# Patient Record
Sex: Male | Born: 1935 | Race: White | Hispanic: No | Marital: Married | Smoking: Former smoker
Health system: Southern US, Community
[De-identification: ages and names within clinical notes are randomized; demographics above are authoritative.]

## PROBLEM LIST (undated history)

## (undated) DIAGNOSIS — I10 Essential (primary) hypertension: Secondary | ICD-10-CM

## (undated) DIAGNOSIS — C801 Malignant (primary) neoplasm, unspecified: Secondary | ICD-10-CM

## (undated) DIAGNOSIS — I4891 Unspecified atrial fibrillation: Secondary | ICD-10-CM

## (undated) DIAGNOSIS — M1990 Unspecified osteoarthritis, unspecified site: Secondary | ICD-10-CM

## (undated) DIAGNOSIS — E785 Hyperlipidemia, unspecified: Secondary | ICD-10-CM

## (undated) DIAGNOSIS — I2510 Atherosclerotic heart disease of native coronary artery without angina pectoris: Secondary | ICD-10-CM

## (undated) DIAGNOSIS — A389 Scarlet fever, uncomplicated: Secondary | ICD-10-CM

## (undated) HISTORY — PX: CORONARY ARTERY BYPASS GRAFT: SHX141

## (undated) HISTORY — PX: CARDIAC CATHETERIZATION: SHX172

## (undated) HISTORY — PX: CATARACT EXTRACTION W/ INTRAOCULAR LENS  IMPLANT, BILATERAL: SHX1307

## (undated) HISTORY — PX: PROSTATE SURGERY: SHX751

## (undated) HISTORY — PX: NASAL SINUS SURGERY: SHX719

## (undated) HISTORY — PX: JOINT REPLACEMENT: SHX530

---

## 2005-09-27 ENCOUNTER — Other Ambulatory Visit: Payer: Self-pay

## 2005-09-27 ENCOUNTER — Ambulatory Visit: Payer: Self-pay | Admitting: Urology

## 2005-10-05 ENCOUNTER — Ambulatory Visit: Payer: Self-pay | Admitting: Urology

## 2006-10-05 ENCOUNTER — Other Ambulatory Visit: Payer: Self-pay

## 2006-10-05 ENCOUNTER — Ambulatory Visit: Payer: Self-pay | Admitting: Unknown Physician Specialty

## 2007-10-26 ENCOUNTER — Other Ambulatory Visit: Payer: Self-pay

## 2007-10-26 ENCOUNTER — Inpatient Hospital Stay: Payer: Self-pay | Admitting: Internal Medicine

## 2008-07-18 ENCOUNTER — Ambulatory Visit: Payer: Self-pay | Admitting: Orthopedic Surgery

## 2009-02-24 ENCOUNTER — Ambulatory Visit: Payer: Self-pay | Admitting: Unknown Physician Specialty

## 2009-03-03 ENCOUNTER — Inpatient Hospital Stay: Payer: Self-pay | Admitting: Unknown Physician Specialty

## 2009-03-27 ENCOUNTER — Ambulatory Visit: Payer: Self-pay | Admitting: Family Medicine

## 2011-04-20 ENCOUNTER — Observation Stay: Payer: Self-pay | Admitting: Cardiology

## 2013-06-25 ENCOUNTER — Ambulatory Visit: Payer: Self-pay | Admitting: Otolaryngology

## 2013-07-02 ENCOUNTER — Ambulatory Visit: Payer: Self-pay | Admitting: Otolaryngology

## 2013-07-02 LAB — BASIC METABOLIC PANEL
Chloride: 106 mmol/L (ref 98–107)
Co2: 29 mmol/L (ref 21–32)
EGFR (Non-African Amer.): 56 — ABNORMAL LOW
Glucose: 100 mg/dL — ABNORMAL HIGH (ref 65–99)
Osmolality: 280 (ref 275–301)

## 2013-07-02 LAB — CBC WITH DIFFERENTIAL/PLATELET
Basophil #: 0 10*3/uL (ref 0.0–0.1)
Eosinophil #: 0.3 10*3/uL (ref 0.0–0.7)
Eosinophil %: 3.8 %
HGB: 13.6 g/dL (ref 13.0–18.0)
Lymphocyte #: 1.6 10*3/uL (ref 1.0–3.6)
MCH: 32.4 pg (ref 26.0–34.0)
MCHC: 34.7 g/dL (ref 32.0–36.0)
Monocyte #: 0.7 x10 3/mm (ref 0.2–1.0)
Monocyte %: 10.1 %
Neutrophil #: 4.8 10*3/uL (ref 1.4–6.5)
Neutrophil %: 64.5 %
Platelet: 190 10*3/uL (ref 150–440)
RBC: 4.21 10*6/uL — ABNORMAL LOW (ref 4.40–5.90)

## 2013-07-05 ENCOUNTER — Ambulatory Visit: Payer: Self-pay | Admitting: Otolaryngology

## 2013-07-06 LAB — PATHOLOGY REPORT

## 2013-07-26 LAB — CULTURE, FUNGUS WITHOUT SMEAR

## 2013-08-16 ENCOUNTER — Ambulatory Visit: Payer: Self-pay | Admitting: Neurological Surgery

## 2014-11-27 ENCOUNTER — Emergency Department: Payer: Self-pay | Admitting: Emergency Medicine

## 2014-11-27 LAB — CBC WITH DIFFERENTIAL/PLATELET
BASOS PCT: 0.2 %
Basophil #: 0 10*3/uL (ref 0.0–0.1)
EOS PCT: 0.4 %
Eosinophil #: 0.1 10*3/uL (ref 0.0–0.7)
HCT: 38.3 % — AB (ref 40.0–52.0)
HGB: 12.5 g/dL — ABNORMAL LOW (ref 13.0–18.0)
LYMPHS ABS: 1.4 10*3/uL (ref 1.0–3.6)
Lymphocyte %: 10.5 %
MCH: 32.2 pg (ref 26.0–34.0)
MCHC: 32.6 g/dL (ref 32.0–36.0)
MCV: 99 fL (ref 80–100)
Monocyte #: 1.5 x10 3/mm — ABNORMAL HIGH (ref 0.2–1.0)
Monocyte %: 11.5 %
NEUTROS PCT: 77.4 %
Neutrophil #: 10.3 10*3/uL — ABNORMAL HIGH (ref 1.4–6.5)
Platelet: 189 10*3/uL (ref 150–440)
RBC: 3.88 10*6/uL — ABNORMAL LOW (ref 4.40–5.90)
RDW: 12.9 % (ref 11.5–14.5)
WBC: 13.3 10*3/uL — AB (ref 3.8–10.6)

## 2014-11-27 LAB — URINALYSIS, COMPLETE
BILIRUBIN, UR: NEGATIVE
Blood: NEGATIVE
GLUCOSE, UR: NEGATIVE mg/dL (ref 0–75)
KETONE: NEGATIVE
Leukocyte Esterase: NEGATIVE
Nitrite: NEGATIVE
Ph: 5 (ref 4.5–8.0)
Protein: >=500
RBC,UR: 44 /HPF (ref 0–5)
Specific Gravity: 1.027 (ref 1.003–1.030)
WBC UR: 26 /HPF (ref 0–5)

## 2014-11-27 LAB — BASIC METABOLIC PANEL
ANION GAP: 7 (ref 7–16)
Anion Gap: 9 (ref 7–16)
BUN: 24 mg/dL — ABNORMAL HIGH (ref 7–18)
BUN: 23 mg/dL — ABNORMAL HIGH (ref 7–18)
CALCIUM: 8.6 mg/dL (ref 8.5–10.1)
CHLORIDE: 108 mmol/L — AB (ref 98–107)
CO2: 25 mmol/L (ref 21–32)
CREATININE: 1.75 mg/dL — AB (ref 0.60–1.30)
Calcium, Total: 8.5 mg/dL (ref 8.5–10.1)
Chloride: 108 mmol/L — ABNORMAL HIGH (ref 98–107)
Co2: 22 mmol/L (ref 21–32)
Creatinine: 1.74 mg/dL — ABNORMAL HIGH (ref 0.60–1.30)
EGFR (African American): 48 — ABNORMAL LOW
EGFR (Non-African Amer.): 40 — ABNORMAL LOW
GFR CALC AF AMER: 48 — AB
GFR CALC NON AF AMER: 40 — AB
GLUCOSE: 105 mg/dL — AB (ref 65–99)
Glucose: 121 mg/dL — ABNORMAL HIGH (ref 65–99)
OSMOLALITY: 283 (ref 275–301)
OSMOLALITY: 283 (ref 275–301)
POTASSIUM: 4 mmol/L (ref 3.5–5.1)
Potassium: 4.6 mmol/L (ref 3.5–5.1)
SODIUM: 139 mmol/L (ref 136–145)
Sodium: 140 mmol/L (ref 136–145)

## 2014-11-27 LAB — ETHANOL: Ethanol: <3 mg/dL

## 2014-11-27 LAB — TROPONIN I

## 2014-11-29 LAB — URINE CULTURE

## 2015-04-11 NOTE — Op Note (Signed)
PATIENT NAME:  Steve Aguirre, Steve Aguirre MR#:  [id number] DATE OF BIRTH:  03-22-1927  DATE OF PROCEDURE:  07/05/2013  PREOPERATIVE DIAGNOSES:  1.  Chronic pansinusitis (status post previous sinus surgery, 50 years ago.).  2.  Septal deviation.   POSTOPERATIVE DIAGNOSES:  1.  Chronic pansinusitis (status post previous sinus surgery, 50 years ago.).  2.  Septal deviation.   PROCEDURES: 1.  Image guided sinus surgery (Stryker navigation).  2.  Revision bilateral frontal sinusotomies with tissue removal.  3.  Revision anterior and posterior ethmoidectomies with tissue removal.  4.  Bilateral sphenoidectomies with tissue removal.  5.  Revision bilateral maxillary sinus antrostomies with tissue removal.  6.  Septoplasty.   SURGEON: Janalee Dane, MD  ANESTHESIA: General endotracheal.   DESCRIPTION OF PROCEDURE:  The patient was identified in the holding area and was brought back to the operating room in the supine position on the operating room table.  After general endotracheal anesthesia had been induced the patient was turned 90 degrees counter clockwise from anesthesia.  The nose was anesthetized with infraorbital nerve blocks and septal injection with 0.5% Lidocaine and 0.25% Bupivacaine mixed with 1:150,000 with Epinephrine and phenylephrine Lidocaine soaked pledgets, two on each side were placed and the face was prepped and draped in the usual fashion.  The pledgets were removed.  A 15 blade was used to make a left-sided hemitransfixion incision and septal mucoperichondrial mucoperiosteal leaflets elevated.  There was a large inferior spur that was resected with Jansen-Middleton forceps.  The remaining septum was deviated back and forth in an accordion like fashion.  The bony cartilaginous junction was then divided and a moderate amount of vomer and perpendicular plate was taken down with Jansen-Middleton forceps, releasing the tension on the remaining septum.  The septum then swung back into the  midline.  The septal leaflets were closed with quilting 4-0 chromic suture.  The left sided hemitransfixion incision was closed with 4-0 plain gut.  Attention was directed to the turbinates which had been previously injected on the left.  The head of the inferior turbinate on the left was incised with a 15 blade and the medial mucoperiosteum was elevated using a Psychologist, educational.  Once this had been elevated Knight scissors were used to resect the conchal bone and lateral mucoperiosteum.   The image-guided sinus surgery system mask was attached and registration was carried out in the standard fashion using the appropriate fiduciary points. Calibration of the system was confirmed and extensive review preoperatively and intraoperatively was carried out.   Attention was directed to the sinus surgery portion of the surgery. The left side was addressed first. The middle turbinate was gently medialized. The uncinate process was then identified and completely resected revealing the natural maxillary sinus ostium. The natural maxillary sinus ostium was progressively enlarged by removing tissue to create a large maxillary antrostomy, removing the diseased tissue. A culture swab was then used to culture the purulence in the maxillary antrum.  The maxillary antrum was then irrigated copiously with saline. Attention was directed to the ethmoid sinuses where bone and mucosal tissue from the ethmoid sinus was taken down from anterior to posterior preserving the skull base and lamina papyracea, removing diseased tissue. After the ethmoid sinuses had been completely cleaned out of polypoid chronically inflamed mucosa preserving the mucosa on the medial side of the middle turbinate along the skull base and along the lamina papyracea, attention was directed to the sphenoid recess. The sphenoid sinus ostium was identified and  progressively enlarged with Kerrison Rongeur, staying inferior and medial in orientation, to create a large  sphenoid sinus antrostomy, removing diseased tissue. The 30 degree scope was then used to explore the frontal recess. The frontal recess was progressively enlarged meticulously, removing diseased tissue, and the frontal sinus was then copiously irrigated with saline as was the sphenoid sinus. Attention was then directed to the right side where an identical series of procedures was accomplished. Upon completion of the sinus surgery, the Surgiflo was placed. A total of one unit of Surgiflo was used. Estimated blood loss was less than 10 milliliters.  Temporary Telfa pledgets were placed and tied over the columella to prevent dislodging. The patient was allowed to emerge from anesthesia, extubated in the operating room and taken to the recovery room in stable condition.  There were no complications.    FINDINGS: There was diffuse polypoid disease involving the all of the paranasal sinuses. There was profuse pus in the left maxillary and right ethmoid sinuses. The left maxillary sinus pus was cultured with a culture swab.    ____________________________ J. Nadeen Landau, MD jmc:cc D: 07/05/2013 20:28:10 ET T: 07/05/2013 22:18:04 ET JOB#: [id number]  cc: Janalee Dane, MD, <Dictator> Nicholos Johns MD ELECTRONICALLY SIGNED 08/01/2013 51:76

## 2016-01-02 ENCOUNTER — Other Ambulatory Visit
Admission: RE | Admit: 2016-01-02 | Discharge: 2016-01-02 | Disposition: A | Discharge status: Home / Self Care | Payer: Medicare Other | Source: Ambulatory Visit | Attending: Cardiology | Admitting: Cardiology

## 2016-01-02 DIAGNOSIS — R0789 Other chest pain: Secondary | ICD-10-CM | POA: Diagnosis present

## 2016-01-02 LAB — TROPONIN I: TROPONIN I: 0.03 ng/mL (ref <0.031)

## 2016-01-21 ENCOUNTER — Encounter
Admission: RE | Admit: 2016-01-21 | Discharge: 2016-01-21 | Disposition: A | Discharge status: Home / Self Care | Payer: Medicare Other | Source: Ambulatory Visit | Attending: Cardiology | Admitting: Cardiology

## 2016-01-21 DIAGNOSIS — I2510 Atherosclerotic heart disease of native coronary artery without angina pectoris: Secondary | ICD-10-CM | POA: Diagnosis not present

## 2016-01-21 DIAGNOSIS — R001 Bradycardia, unspecified: Secondary | ICD-10-CM | POA: Insufficient documentation

## 2016-01-21 DIAGNOSIS — Z01812 Encounter for preprocedural laboratory examination: Secondary | ICD-10-CM | POA: Insufficient documentation

## 2016-01-21 DIAGNOSIS — Z01818 Encounter for other preprocedural examination: Secondary | ICD-10-CM

## 2016-01-21 DIAGNOSIS — I4891 Unspecified atrial fibrillation: Secondary | ICD-10-CM | POA: Diagnosis not present

## 2016-01-21 DIAGNOSIS — E785 Hyperlipidemia, unspecified: Secondary | ICD-10-CM | POA: Diagnosis not present

## 2016-01-21 HISTORY — DX: Atherosclerotic heart disease of native coronary artery without angina pectoris: I25.10

## 2016-01-21 HISTORY — DX: Unspecified osteoarthritis, unspecified site: M19.90

## 2016-01-21 HISTORY — DX: Malignant (primary) neoplasm, unspecified: C80.1

## 2016-01-21 HISTORY — DX: Unspecified atrial fibrillation: I48.91

## 2016-01-21 HISTORY — DX: Scarlet fever, uncomplicated: A38.9

## 2016-01-21 HISTORY — DX: Essential (primary) hypertension: I10

## 2016-01-21 HISTORY — DX: Hyperlipidemia, unspecified: E78.5

## 2016-01-21 LAB — DIFFERENTIAL
Basophils Absolute: 0 10*3/uL (ref 0–0.1)
Basophils Relative: 0 %
Eosinophils Absolute: 0.1 10*3/uL (ref 0–0.7)
Eosinophils Relative: 2 %
LYMPHS PCT: 14 %
Lymphs Abs: 1.4 10*3/uL (ref 1.0–3.6)
MONO ABS: 0.7 10*3/uL (ref 0.2–1.0)
Monocytes Relative: 8 %
NEUTROS ABS: 7.7 10*3/uL — AB (ref 1.4–6.5)
NEUTROS PCT: 76 %

## 2016-01-21 LAB — APTT: APTT: 52 s — AB (ref 24–36)

## 2016-01-21 LAB — BASIC METABOLIC PANEL
Anion gap: 7 (ref 5–15)
BUN: 23 mg/dL — AB (ref 6–20)
CO2: 28 mmol/L (ref 22–32)
CREATININE: 1.19 mg/dL (ref 0.61–1.24)
Calcium: 9.4 mg/dL (ref 8.9–10.3)
Chloride: 104 mmol/L (ref 101–111)
GFR calc Af Amer: >60 mL/min (ref >60)
GFR calc non Af Amer: 53 mL/min — ABNORMAL LOW (ref >60)
Glucose, Bld: 113 mg/dL — ABNORMAL HIGH (ref 65–99)
POTASSIUM: 4.5 mmol/L (ref 3.5–5.1)
SODIUM: 139 mmol/L (ref 135–145)

## 2016-01-21 LAB — CBC
HCT: 38.3 % — ABNORMAL LOW (ref 40.0–52.0)
Hemoglobin: 12.9 g/dL — ABNORMAL LOW (ref 13.0–18.0)
MCH: 31.2 pg (ref 26.0–34.0)
MCHC: 33.6 g/dL (ref 32.0–36.0)
MCV: 92.9 fL (ref 80.0–100.0)
PLATELETS: 269 10*3/uL (ref 150–440)
RBC: 4.13 MIL/uL — AB (ref 4.40–5.90)
RDW: 13.4 % (ref 11.5–14.5)
WBC: 10 10*3/uL (ref 3.8–10.6)

## 2016-01-21 LAB — PROTIME-INR
INR: 1.11
PROTHROMBIN TIME: 14.5 s (ref 11.4–15.0)

## 2016-01-21 LAB — SURGICAL PCR SCREEN
MRSA, PCR: NEGATIVE
STAPHYLOCOCCUS AUREUS: NEGATIVE

## 2016-01-21 NOTE — Patient Instructions (Signed)
  Your procedure is scheduled on: 01/26/16 Mon Report to Day Surgery 2nd floor medical mall To find out your arrival time please call [phone] between 1PM - 3PM on 01/23/16.  Remember: Instructions that are not followed completely may result in serious medical risk, up to and including death, or upon the discretion of your surgeon and anesthesiologist your surgery may need to be rescheduled.    _x___ 1. Do not eat food or drink liquids after midnight. No gum chewing or hard candies.     ____ 2. No Alcohol for 24 hours before or after surgery.   ____ 3. Bring all medications with you on the day of surgery if instructed.    __x__ 4. Notify your doctor if there is any change in your medical condition     (cold, fever, infections).     Do not wear jewelry, make-up, hairpins, clips or nail polish.  Do not wear lotions, powders, or perfumes. You may wear deodorant.  Do not shave 48 hours prior to surgery. Men may shave face and neck.  Do not bring valuables to the hospital.    [facility] is not responsible for any belongings or valuables.               Contacts, dentures or bridgework may not be worn into surgery.  Leave your suitcase in the car. After surgery it may be brought to your room.  For patients admitted to the hospital, discharge time is determined by your                treatment team.   Patients discharged the day of surgery will not be allowed to drive home.   Please read over the following fact sheets that you were given:   MRSA Information   _x___ Take these medicines the morning of surgery with A SIP OF WATER:    1. famotidine (PEPCID) 20 MG tablet  2. ramipril (ALTACE) 10 MG capsule  3.   4.  5.  6.  ____ Fleet Enema (as directed)   _x___ Use CHG Soap as directed  ____ Use inhalers on the day of surgery  ____ Stop metformin 2 days prior to surgery    ____ Take 1/2 of usual insulin dose the night before surgery and none on the morning of surgery.   _x___  Stop Coumadin/Plavix/aspirin on Stop Xarelto 3 days before surgery per Dr Ubaldo Glassing, ask Dr Ubaldo Glassing about stopping Aspirin  ____ Stop Anti-inflammatories on    ____ Stop supplements until after surgery.    ____ Bring C-Pap to the hospital.

## 2016-01-26 ENCOUNTER — Ambulatory Visit: Payer: Medicare Other | Admitting: Anesthesiology

## 2016-01-26 ENCOUNTER — Ambulatory Visit
Admission: RE | Admit: 2016-01-26 | Discharge: 2016-01-27 | Disposition: A | Discharge status: Home / Self Care | Payer: Medicare Other | Source: Ambulatory Visit | Attending: Cardiology | Admitting: Cardiology

## 2016-01-26 ENCOUNTER — Encounter: Payer: Self-pay | Admitting: *Deleted

## 2016-01-26 ENCOUNTER — Encounter
Admission: RE | Disposition: A | Discharge status: Home / Self Care | Payer: Self-pay | Source: Ambulatory Visit | Attending: Cardiology

## 2016-01-26 ENCOUNTER — Observation Stay: Payer: Medicare Other

## 2016-01-26 ENCOUNTER — Ambulatory Visit: Payer: Medicare Other

## 2016-01-26 DIAGNOSIS — R109 Unspecified abdominal pain: Secondary | ICD-10-CM | POA: Insufficient documentation

## 2016-01-26 DIAGNOSIS — Z7982 Long term (current) use of aspirin: Secondary | ICD-10-CM | POA: Diagnosis not present

## 2016-01-26 DIAGNOSIS — I517 Cardiomegaly: Secondary | ICD-10-CM | POA: Insufficient documentation

## 2016-01-26 DIAGNOSIS — I482 Chronic atrial fibrillation: Secondary | ICD-10-CM | POA: Diagnosis not present

## 2016-01-26 DIAGNOSIS — Z96652 Presence of left artificial knee joint: Secondary | ICD-10-CM | POA: Insufficient documentation

## 2016-01-26 DIAGNOSIS — I25810 Atherosclerosis of coronary artery bypass graft(s) without angina pectoris: Secondary | ICD-10-CM | POA: Diagnosis not present

## 2016-01-26 DIAGNOSIS — Z7901 Long term (current) use of anticoagulants: Secondary | ICD-10-CM | POA: Diagnosis not present

## 2016-01-26 DIAGNOSIS — I10 Essential (primary) hypertension: Secondary | ICD-10-CM | POA: Diagnosis not present

## 2016-01-26 DIAGNOSIS — Z8546 Personal history of malignant neoplasm of prostate: Secondary | ICD-10-CM | POA: Diagnosis not present

## 2016-01-26 DIAGNOSIS — E782 Mixed hyperlipidemia: Secondary | ICD-10-CM | POA: Diagnosis not present

## 2016-01-26 DIAGNOSIS — Z955 Presence of coronary angioplasty implant and graft: Secondary | ICD-10-CM | POA: Insufficient documentation

## 2016-01-26 DIAGNOSIS — R0602 Shortness of breath: Secondary | ICD-10-CM | POA: Insufficient documentation

## 2016-01-26 DIAGNOSIS — Z951 Presence of aortocoronary bypass graft: Secondary | ICD-10-CM | POA: Insufficient documentation

## 2016-01-26 DIAGNOSIS — Z8249 Family history of ischemic heart disease and other diseases of the circulatory system: Secondary | ICD-10-CM | POA: Insufficient documentation

## 2016-01-26 DIAGNOSIS — Z79899 Other long term (current) drug therapy: Secondary | ICD-10-CM | POA: Diagnosis not present

## 2016-01-26 DIAGNOSIS — M1990 Unspecified osteoarthritis, unspecified site: Secondary | ICD-10-CM | POA: Insufficient documentation

## 2016-01-26 DIAGNOSIS — Z87898 Personal history of other specified conditions: Secondary | ICD-10-CM | POA: Insufficient documentation

## 2016-01-26 DIAGNOSIS — Z833 Family history of diabetes mellitus: Secondary | ICD-10-CM | POA: Insufficient documentation

## 2016-01-26 DIAGNOSIS — R079 Chest pain, unspecified: Secondary | ICD-10-CM

## 2016-01-26 DIAGNOSIS — Z807 Family history of other malignant neoplasms of lymphoid, hematopoietic and related tissues: Secondary | ICD-10-CM | POA: Diagnosis not present

## 2016-01-26 DIAGNOSIS — Z950 Presence of cardiac pacemaker: Secondary | ICD-10-CM

## 2016-01-26 DIAGNOSIS — Z9849 Cataract extraction status, unspecified eye: Secondary | ICD-10-CM | POA: Insufficient documentation

## 2016-01-26 DIAGNOSIS — R001 Bradycardia, unspecified: Secondary | ICD-10-CM | POA: Insufficient documentation

## 2016-01-26 DIAGNOSIS — R531 Weakness: Secondary | ICD-10-CM | POA: Insufficient documentation

## 2016-01-26 DIAGNOSIS — I495 Sick sinus syndrome: Secondary | ICD-10-CM | POA: Diagnosis present

## 2016-01-26 DIAGNOSIS — Z87891 Personal history of nicotine dependence: Secondary | ICD-10-CM | POA: Diagnosis not present

## 2016-01-26 DIAGNOSIS — R5383 Other fatigue: Secondary | ICD-10-CM | POA: Insufficient documentation

## 2016-01-26 HISTORY — PX: PACEMAKER INSERTION: SHX728

## 2016-01-26 SURGERY — INSERTION, CARDIAC PACEMAKER
Anesthesia: Monitor Anesthesia Care

## 2016-01-26 MED ORDER — ACETAMINOPHEN 325 MG PO TABS: 325.0000 mg | ORAL_TABLET | ORAL | Status: DC | PRN

## 2016-01-26 MED ORDER — SIMVASTATIN 40 MG PO TABS
40.0000 mg | ORAL_TABLET | Freq: Every day | ORAL | Status: DC
  Administered 2016-01-26: 40 mg via ORAL
  Filled 2016-01-26 (×2): qty 1

## 2016-01-26 MED ORDER — FENTANYL CITRATE (PF) 100 MCG/2ML IJ SOLN: 25.0000 ug | INTRAMUSCULAR | Status: DC | PRN

## 2016-01-26 MED ORDER — SODIUM CHLORIDE 0.9 % IJ SOLN
INTRAMUSCULAR | Status: DC | PRN
  Administered 2016-01-26: 15:00:00
  Administered 2016-01-26: 6 mL via INTRAVENOUS

## 2016-01-26 MED ORDER — LORAZEPAM 2 MG/ML IJ SOLN
1.0000 mg | Freq: Four times a day (QID) | INTRAMUSCULAR | Status: DC | PRN
  Administered 2016-01-27: 1 mg via INTRAVENOUS
  Filled 2016-01-26 (×2): qty 1

## 2016-01-26 MED ORDER — CEFAZOLIN SODIUM 1-5 GM-% IV SOLN
1.0000 g | Freq: Four times a day (QID) | INTRAVENOUS | Status: AC
  Administered 2016-01-26 – 2016-01-27 (×3): 1 g via INTRAVENOUS
  Filled 2016-01-26 (×4): qty 50

## 2016-01-26 MED ORDER — SODIUM CHLORIDE FLUSH 0.9 % IV SOLN
INTRAVENOUS | Status: AC
  Filled 2016-01-26: qty 20

## 2016-01-26 MED ORDER — SODIUM CHLORIDE 0.9 % IR SOLN
Freq: Once | Status: DC
  Filled 2016-01-26 (×2): qty 2

## 2016-01-26 MED ORDER — CEFAZOLIN SODIUM 1-5 GM-% IV SOLN
INTRAVENOUS | Status: AC
  Administered 2016-01-26: 11:00:00
  Filled 2016-01-26: qty 50

## 2016-01-26 MED ORDER — ONDANSETRON HCL 4 MG/2ML IJ SOLN: 4.0000 mg | Freq: Once | INTRAMUSCULAR | Status: DC | PRN

## 2016-01-26 MED ORDER — RAMIPRIL 2.5 MG PO CAPS
10.0000 mg | ORAL_CAPSULE | Freq: Every day | ORAL | Status: DC
  Administered 2016-01-27: 10 mg via ORAL
  Filled 2016-01-26: qty 4
  Filled 2016-01-26: qty 1

## 2016-01-26 MED ORDER — LACTATED RINGERS IV SOLN
INTRAVENOUS | Status: DC
  Administered 2016-01-26: 13:00:00 via INTRAVENOUS

## 2016-01-26 MED ORDER — MIDAZOLAM HCL 2 MG/2ML IJ SOLN
INTRAMUSCULAR | Status: DC | PRN
  Administered 2016-01-26: .5 mg via INTRAVENOUS

## 2016-01-26 MED ORDER — GENTAMICIN SULFATE 40 MG/ML IJ SOLN
INTRAMUSCULAR | Status: AC
  Filled 2016-01-26: qty 2

## 2016-01-26 MED ORDER — LIDOCAINE 1 % OPTIME INJ - NO CHARGE
INTRAMUSCULAR | Status: DC | PRN
  Administered 2016-01-26: 26 mL

## 2016-01-26 MED ORDER — FENTANYL CITRATE (PF) 100 MCG/2ML IJ SOLN
INTRAMUSCULAR | Status: DC | PRN
  Administered 2016-01-26 (×2): 25 ug via INTRAVENOUS

## 2016-01-26 MED ORDER — CEFAZOLIN SODIUM 1-5 GM-% IV SOLN
1.0000 g | Freq: Once | INTRAVENOUS | Status: AC
  Administered 2016-01-26: 1 g via INTRAVENOUS

## 2016-01-26 MED ORDER — ONDANSETRON HCL 4 MG/2ML IJ SOLN: 4.0000 mg | Freq: Four times a day (QID) | INTRAMUSCULAR | Status: DC | PRN

## 2016-01-26 MED ORDER — LIDOCAINE HCL (CARDIAC) 20 MG/ML IV SOLN
INTRAVENOUS | Status: DC | PRN
  Administered 2016-01-26: 40 mg via INTRAVENOUS

## 2016-01-26 MED ORDER — PROPOFOL 10 MG/ML IV BOLUS
INTRAVENOUS | Status: DC | PRN
  Administered 2016-01-26: 20 mg via INTRAVENOUS
  Administered 2016-01-26: 10 mg via INTRAVENOUS
  Administered 2016-01-26: 20 mg via INTRAVENOUS
  Administered 2016-01-26: 10 mg via INTRAVENOUS
  Administered 2016-01-26 (×2): 20 mg via INTRAVENOUS

## 2016-01-26 MED ORDER — PROPOFOL 500 MG/50ML IV EMUL
INTRAVENOUS | Status: DC | PRN
  Administered 2016-01-26: 50 ug/kg/min via INTRAVENOUS

## 2016-01-26 MED ORDER — SODIUM CHLORIDE 0.9 % IJ SOLN
INTRAMUSCULAR | Status: AC
  Filled 2016-01-26: qty 50

## 2016-01-26 MED ORDER — SODIUM CHLORIDE 0.9 % IR SOLN
Status: DC | PRN
  Administered 2016-01-26: 50 mL

## 2016-01-26 SURGICAL SUPPLY — 35 items
BAG DECANTER FOR FLEXI CONT (MISCELLANEOUS) ×3 IMPLANT
BRUSH SCRUB 4% CHG (MISCELLANEOUS) ×3 IMPLANT
CABLE SURG 12 DISP A/V CHANNEL (MISCELLANEOUS) ×3 IMPLANT
CANISTER SUCT 1200ML W/VALVE (MISCELLANEOUS) ×3 IMPLANT
CHLORAPREP W/TINT 26ML (MISCELLANEOUS) ×3 IMPLANT
COVER LIGHT HANDLE STERIS (MISCELLANEOUS) ×6 IMPLANT
COVER MAYO STAND STRL (DRAPES) ×3 IMPLANT
DRAPE C-ARM XRAY 36X54 (DRAPES) ×3 IMPLANT
DRESSING TELFA 4X3 1S ST N-ADH (GAUZE/BANDAGES/DRESSINGS) ×3 IMPLANT
DRSG TEGADERM 4X4.75 (GAUZE/BANDAGES/DRESSINGS) ×3 IMPLANT
ELECT REM PT RETURN 9FT ADLT (ELECTROSURGICAL) ×3
ELECTRODE REM PT RTRN 9FT ADLT (ELECTROSURGICAL) ×1 IMPLANT
GLIDEWIRE STIFF .35X180X3 HYDR (WIRE) ×3 IMPLANT
GLOVE BIO SURGEON STRL SZ7.5 (GLOVE) ×3 IMPLANT
GLOVE BIO SURGEON STRL SZ8 (GLOVE) ×3 IMPLANT
GOWN STRL REUS W/ TWL LRG LVL3 (GOWN DISPOSABLE) ×1 IMPLANT
GOWN STRL REUS W/ TWL XL LVL3 (GOWN DISPOSABLE) ×1 IMPLANT
GOWN STRL REUS W/TWL LRG LVL3 (GOWN DISPOSABLE) ×2
GOWN STRL REUS W/TWL XL LVL3 (GOWN DISPOSABLE) ×2
IMMOBILIZER SHDR MD LX WHT (SOFTGOODS) IMPLANT
IMMOBILIZER SHDR XL LX WHT (SOFTGOODS) IMPLANT
INTRO PACEMKR SHEATH II 7FR (MISCELLANEOUS) ×3
INTRODUCER PACEMKR SHTH II 7FR (MISCELLANEOUS) ×1 IMPLANT
IV NS 500ML (IV SOLUTION) ×2
IV NS 500ML BAXH (IV SOLUTION) ×1 IMPLANT
KIT RM TURNOVER STRD PROC AR (KITS) ×3 IMPLANT
LABEL OR SOLS (LABEL) ×3 IMPLANT
LEAD CAPSURE NOVUS 5092-58CM (Lead) ×3 IMPLANT
MARKER SKIN DUAL TIP RULER LAB (MISCELLANEOUS) ×3 IMPLANT
PACEMAKER ADAPTA SR ADSR01 (Pacemaker) ×1 IMPLANT
PACK PACE INSERTION (MISCELLANEOUS) ×3 IMPLANT
PAD STATPAD (MISCELLANEOUS) ×3 IMPLANT
PAD TELFA 2X3 NADH STRL (GAUZE/BANDAGES/DRESSINGS) ×3 IMPLANT
PPM ADAPTA SR ADSR01 (Pacemaker) ×3 IMPLANT
SUT SILK 0 SH 30 (SUTURE) ×9 IMPLANT

## 2016-01-26 NOTE — H&P (Signed)
Printout Information Document Contents Office Visit Document Received Date 01/21/2016 Document Source Organization [facility] Patient Demographics  Reason for Visit  Encounter Details  Last Filed Vital Signs  Progress Notes  Plan of Treatment  Visit Diagnoses  Document Information Encounter Summary - Dair Necessary [id number] y.o. Steve Aguirre of Feb. 01, 2017 Patient Demographics Patient Address Communication Language Race / Ethnicity  [address] [place], [place] [id number]  [phone] [facility]) [phone] (Mobile)  English (Preferred) White / Not Hispanic or Latino  Reason for Visit Reason Comments  Fatigue i run out of gas pretty fast  Shortness of Breath when I get tired I get short of breath  Encounter Details Date Type Department Care Team Description  01/19/2016 Office Visit [facility]  [place], [place] [address]  [phone]  [name], [place] [address]  [place], [place] [address]  [phone]  [phone] (Fax)  Bradycardia (Primary Dx);Chronic atrial fibrillation ([place]);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;Essential hypertension;Hyperlipidemia, unspecified hyperlipidemia type  Last Filed Vital Signs - in this encounter Vital Sign Reading Time Taken  Blood Pressure 122/68 01/19/2016 2:47 PM EST  Pulse 54 01/19/2016 2:47 PM EST  Temperature - -  Respiratory Rate 12 01/19/2016 2:47 PM EST  Oxygen Saturation - -  Inhaled Oxygen Concentration - -  Weight 69.9 kg (154 lb 3.2 oz) 01/19/2016 2:47 PM EST  Height 165.1 cm (5\' 5" ) 01/19/2016 2:47 PM EST  Body Mass Index 25.66 01/19/2016 2:47 PM EST  Progress Notes - in this encounter [name], MD - 01/19/2016 2:30 PM EST Formatting of this note may be different from the original.   Chief Complaint: Chief Complaint  Patient presents with  . Fatigue  i run out of gas pretty fast  .  Shortness of Breath  when I get tired I get short of breath  Date of Service: 01/19/2016 Date of Birth: October 26, 1927 PCP: Macie Burows, MD  History of Present Illness: Steve Aguirre is a 82 y.o.male patient who has a history of atrial fibrillation, hypertension and hyperlipidemia. Is post coronary artery bypass grafting in 1999. He was recently seen with abdominal pain was noted to be significantly bradycardic. He was taken off of sotalol and a Holter monitor was placed showing up to 7 second pauses with atrial fibrillation with slow ventricular response. He has had no syncope but has fatigue weakness and is not improved since being taken off of sotalol. His average heart rate on his Holter monitor was 45 bpm. Risk and benefits of permanent pacemaker were discussed with the patient. Patient should not drive at least until pacemaker is placed.   Past Medical History Past Medical History  Diagnosis Date  . Arthritis  . Atrial fibrillation, unspecified ([place])  . Cataract cortical, senile  . Coronary artery disease  . History of chicken pox  . History of prostate cancer  . History of scarlet fever  . Hyperlipidemia  . Systemic primary arterial hypertension   Past Surgical History He has a past surgical history that includes Stent Placement Intracranial Percutaneous; Replacement total knee (Left, 03/03/09); Cataract extraction (11/1999); Coronary artery bypass graft (10/1998); ostatectomy Perineal (2006); and Functional endoscopic sinus surgery (08/2014).   Medications and Allergies  Current Medications  Current Outpatient Prescriptions  Medication Sig Dispense Refill  . aspirin 325 MG tablet Take 325 mg by mouth. Reported on 01/02/2016  . famotidine (PEPCID) 20 MG tablet Take 20 mg  by mouth once as needed.  . ramipril (ALTACE) 10 MG capsule TAKE 1 CAPSULE BY MOUTH ONCE A DAY 30 capsule 4  . rivaroxaban (XARELTO) 20 mg tablet Take 1 tablet (20 mg total) by mouth once daily. 30 tablet 11    . simvastatin (ZOCOR) 40 MG tablet TAKE 1 TABLET BY MOUTH AT BEDTIME 30 tablet 4   No current facility-administered medications for this visit.   Allergies: Review of patient's allergies indicates no known allergies.  Social and Family History  Social History reports that he has quit smoking. His smoking use included Cigarettes. He does not have any smokeless tobacco history on file. He reports that he does not drink alcohol.  Family History Family History  Problem Relation Age of Onset  . Heart failure Mother  . Lymphoma Father  . Diabetes type II Sister  . Heart disease Brother  s/p CABG   Review of Systems  Review of Systems  Constitutional: Negative for chills, diaphoresis, fever, malaise/fatigue and weight loss.  HENT: Negative for congestion, ear discharge, hearing loss and tinnitus.  Eyes: Negative for blurred vision.  Respiratory: Negative for hemoptysis and wheezing.  Cardiovascular: Positive for chest pain. Negative for palpitations, orthopnea, claudication, leg swelling and PND.  Gastrointestinal: Positive for abdominal pain. Negative for blood in stool, constipation, diarrhea, heartburn, melena, nausea and vomiting.  Genitourinary: Negative for dysuria, frequency, hematuria and urgency.  Musculoskeletal: Negative for back pain, falls, joint pain and myalgias.  Skin: Negative for itching and rash.  Neurological: Negative for dizziness, tingling, focal weakness, loss of consciousness, weakness and headaches.  Endo/Heme/Allergies: Negative for polydipsia. Does not bruise/bleed easily.  Psychiatric/Behavioral: Negative for depression, memory loss and substance abuse. The patient is not nervous/anxious.    Physical Examination   Vitals: Visit Vitals  . BP 122/68 (BP Location: Left upper arm, Patient Position: Sitting, BP Cuff Size: Small Adult)  . Pulse 54  . Resp 12  . Ht 165.1 cm (5\' 5" )  . Wt 69.9 kg (154 lb 3.2 oz)  . BMI 25.66 kg/m2   Ht:165.1 cm (5\' 5" )  Wt:69.9 kg (154 lb 3.2 oz) [id number] surface area is 1.79 meters squared. Body mass index is 25.66 kg/(m^2).  Wt Readings from Last 3 Encounters:  01/19/16 69.9 kg (154 lb 3.2 oz)  01/05/16 68 kg (150 lb)  01/02/16 70.4 kg (155 lb 3.2 oz)   BP Readings from Last 3 Encounters:  01/19/16 122/68  01/05/16 120/84  01/02/16 142/60   General: Caucasian male with atrial fibrillation complaining of cough and shortness of breath LUNGS Breath Sounds: Normal Percussion: Normal  CARDIOVASCULAR JVP CV wave: no HJR: no Elevation at 90 degrees: None Carotid Pulse: normal pulsation bilaterally Bruit: None Apex: apical impulse normal  Auscultation Rhythm: atrial fibrillation S1: normal S2: normal Clicks: no Rub: no Murmurs: no murmurs  Gallop: None ABDOMEN Liver enlargement: no Pulsatile aorta: no Ascites: no Bruits: no  EXTREMITIES Clubbing: no Edema: trace to 1+ bilateral pedal edema Pulses: peripheral pulses symmetrical Femoral Bruits: no Amputation: no SKIN Rash: no Cyanosis: no Embolic phemonenon: no Bruising: no NEURO Alert and Oriented to person, place and time: yes Non focal: yes LABS Last 3 CBC results: Lab Results  Component Value Date  WBC 10.4 (H) 01/02/2016  WBC 7.5 09/04/2015  WBC 8.4 12/23/2014   Lab Results  Component Value Date  HGB 12.5 (L) 01/02/2016  HGB 14.5 09/04/2015  HGB 13.4 (L) 12/23/2014   Lab Results  Component Value Date  HCT 37.8 (L) 01/02/2016  HCT 42.3 09/04/2015  HCT 39.4 (L) 12/23/2014   Lab Results  Component Value Date  PLT 223 01/02/2016  PLT 188 09/04/2015  PLT 186 12/23/2014   Lab Results  Component Value Date  CREATININE 1.2 01/02/2016  BUN 21 01/02/2016  NA 140 01/02/2016  K 4.4 01/02/2016  CL 105 01/02/2016  CO2 29.6 01/02/2016   Lab Results  Component Value Date  HDL 40.5 09/04/2015  HDL 32.0 12/23/2014   Lab Results  Component Value Date  LDLCALC 85 09/04/2015  LDLCALC 88 12/23/2014   Lab  Results  Component Value Date  TRIG 111 09/04/2015  TRIG 113 12/23/2014   Lab Results  Component Value Date  ALT 10 01/02/2016  AST 17 01/02/2016  ALKPHOS 51 01/02/2016   Assessment and Plan   82 y.o. male with  ICD-10-CM ICD-9-CM  1. Atrial fibrillation, unspecified-currently atrial fibrillation with slow ventricular response. Will continue Xarelto until right before the pacemaker. This was discontinued 2 days prior to the pacemaker. Would remain off of sotalol for now. Consideration of resuming this after pacemaker will be raised. I48.91 427.31  2. 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 no evidence of ischemia clinically, by electrocardiogram or by cardiac marker. I25.810 414.02  3. Essential hypertension-blood pressure is controlled with ramipril and dash diet. Pressure is well-controlled current I10 401.9  4. Mixed hyperlipidemia-currently stable with simvastatin at 40 mg daily which we will continue for now E78.2 272.2    Return in about 4 weeks (around 02/16/2016).  These notes generated with voice recognition software. I apologize for typographical errors.  [name], MD    Plan of Treatment - as of this encounter Upcoming Encounters Upcoming Encounters  Date Type Specialty Care Team Description  02/05/2016 Appointment Cardiology Fath, Aloha Gell, MD  [address] [place]  [place], [place] [address]  [phone]  [phone] (Fax)    03/09/2016 Appointment Internal Medicine Lovie Macadamia, MD  [address] 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 25, 2015 - Present) [phone] (Work) [phone] (Fax)  [address] [facility] [place], [place] [address] Document Coverage Dates Jan. 30, 2017 - Jan. 30, 2017 [place] [phone] (Work) [place], [place] [address] Encounter Providers [name] (Attending) [phone] (Work) [phone] (Fax)  [place], [place] [address] Encounter Date Jan. 30, 2017 - Jan. 30, 2017

## 2016-01-26 NOTE — Anesthesia Postprocedure Evaluation (Signed)
Anesthesia Post Note  Patient: Steve Aguirre  Procedure(s) Performed: Procedure(s) (LRB): INSERTION PACEMAKER (N/A)  Patient location during evaluation: PACU Anesthesia Type: General Level of consciousness: awake and alert Pain management: pain level controlled Vital Signs Assessment: post-procedure vital signs reviewed and stable Respiratory status: spontaneous breathing, nonlabored ventilation, respiratory function stable and patient connected to nasal cannula oxygen Cardiovascular status: blood pressure returned to baseline and stable Postop Assessment: no signs of nausea or vomiting Anesthetic complications: no    Last Vitals:  Filed Vitals:   01/26/16 1410 01/26/16 1441  BP: 143/71 148/69  Pulse: 62 61  Temp: 36.8 C 36.9 C  Resp: 17 18    Last Pain: There were no vitals filed for this visit.               Ane Conerly S

## 2016-01-26 NOTE — Anesthesia Preprocedure Evaluation (Signed)
Anesthesia Evaluation  Patient identified by MRN, date of birth, ID band Patient awake    Reviewed: Allergy & Precautions, NPO status , Patient's Chart, lab work & pertinent test results, reviewed documented beta blocker date and time   Airway Mallampati: II  TM Distance: >3 FB     Dental  (+) Chipped   Pulmonary former smoker,           Cardiovascular hypertension, Pt. on medications + CAD       Neuro/Psych    GI/Hepatic   Endo/Other    Renal/GU      Musculoskeletal  (+) Arthritis ,   Abdominal   Peds  Hematology   Anesthesia Other Findings CABG. A Fib. Cardiac stent.  Reproductive/Obstetrics                             Anesthesia Physical Anesthesia Plan  ASA: III  Anesthesia Plan: MAC   Post-op Pain Management:    Induction:   Airway Management Planned:   Additional Equipment:   Intra-op Plan:   Post-operative Plan:   Informed Consent: I have reviewed the patients History and Physical, chart, labs and discussed the procedure including the risks, benefits and alternatives for the proposed anesthesia with the patient or authorized representative who has indicated his/her understanding and acceptance.     Plan Discussed with: CRNA  Anesthesia Plan Comments:         Anesthesia Quick Evaluation

## 2016-01-26 NOTE — Op Note (Signed)
[facility] Cardiology   01/26/2016                     1:32 PM  PATIENT:  Steve Aguirre    PRE-OPERATIVE DIAGNOSIS:  BRADYCARDIA  POST-OPERATIVE DIAGNOSIS:  Same  PROCEDURE:  INSERTION PACEMAKER  SURGEON:  Kwame Ryland, MD    ANESTHESIA:     PREOPERATIVE INDICATIONS:  Steve Aguirre is a  82 y.o. male with a diagnosis of BRADYCARDIA who failed conservative measures and elected for surgical management.    The risks benefits and alternatives were discussed with the patient preoperatively including but not limited to the risks of infection, bleeding, cardiopulmonary complications, the need for revision surgery, among others, and the patient was willing to proceed.   OPERATIVE PROCEDURE: The patient was brought to the operating room the fasting state. The left pectoral region was prepped and draped in usual sterile manner. Anesthesia was obtained 1% lidocaine locally. A 6 cm incision was performed over the left pectoral region. The pacemaker pocket was generated by electrocautery and blunt dissection. Access was obtained to left subclavian vein by fine needle aspiration. A ventricular lead was positioned to right ventricular apical septum under fluoroscopic guidance. After proper thresholds were obtained the lead was sutured in place. Lead was connected to a rate responsive single-chamber pacemaker generator (Medtronic [id number] ). The pacemaker pocket with was irrigated with gentamicin solution. The pacemaker generator was positioned into the pocket and the pocket was closed with 2-0 and 4-0 Vicryl, respectively. Steri-Strips and a dressing were applied.

## 2016-01-26 NOTE — Progress Notes (Signed)
Patient has tolerated clear liquids well, advanced to cardiac diet. Patient states he doesn't eat much, but encouraged him to have a little something. Ordered him a sandwich. Skin verified with Prince Solian, RN. Patient states "I don't feel pain." No needs at this time.

## 2016-01-26 NOTE — Transfer of Care (Signed)
Immediate Anesthesia Transfer of Care Note  Patient: Steve Aguirre  Procedure(s) Performed: Procedure(s): INSERTION PACEMAKER (N/A)  Patient Location: PACU  Anesthesia Type:MAC  Level of Consciousness: awake  Airway & Oxygen Therapy: Patient Spontanous Breathing and Patient connected to face mask oxygen  Post-op Assessment: Report given to RN and Post -op Vital signs reviewed and stable  Post vital signs: Reviewed and stable  Last Vitals:  Filed Vitals:   01/26/16 1041  BP: 129/51  Pulse: 63  Temp: 36.7 C  Resp: 16    Complications: No apparent anesthesia complications

## 2016-01-26 NOTE — OR Nursing (Signed)
Immobilizer in place

## 2016-01-26 NOTE — Progress Notes (Signed)
Pt. Here with wife, slight confusion.  Has noted area to his forehead and between his eyes from previous fall. Area is scabbed over and healing.

## 2016-01-26 NOTE — Progress Notes (Signed)
Patient arrived to unit. Given report by PACU RN at bedside. Dressing has a tiny dot of blood, marked and will monitor. No complaints of pain or nausea at this time. Arm sling is in place. Telemetry verified with Sonia Baller, NT. Running in the 60's to 70's, paced. Will continue to monitor.

## 2016-01-26 NOTE — Interval H&P Note (Signed)
History and Physical Interval Note:  01/26/2016 9:34 AM  Steve Aguirre  has presented today for surgery, with the diagnosis of BRADYCARDIA  The various methods of treatment have been discussed with the patient and family. After consideration of risks, benefits and other options for treatment, the patient has consented to  Procedure(s): INSERTION PACEMAKER (N/A) as a surgical intervention .  The patient's history has been reviewed, patient examined, no change in status, stable for surgery.  I have reviewed the patient's chart and labs.  Questions were answered to the patient's satisfaction.     Katheryn Culliton

## 2016-01-26 NOTE — Progress Notes (Signed)
Patient was originally alert and oriented, and he still is, but has shown some memory impairment as the day has progressed. Daughter has been at bedside and stated he has some dementia and can get very angry at times if he is treated like a child. Since daughter has been here patient has been fine, but before patient attempted to get up multiple times by himself after being instructed to call for assistance. Will keep reorienting. Patient and daughter educated about pacemaker after care. Patient has to be consistently reminded to keep his left arm still in the immobilizer. Will closely follow.

## 2016-01-27 DIAGNOSIS — R001 Bradycardia, unspecified: Secondary | ICD-10-CM | POA: Diagnosis not present

## 2016-01-27 MED ORDER — LORAZEPAM 2 MG/ML IJ SOLN
1.0000 mg | Freq: Once | INTRAMUSCULAR | Status: AC
  Administered 2016-01-27: 1 mg via INTRAMUSCULAR
  Filled 2016-01-27: qty 1

## 2016-01-27 MED ORDER — CEPHALEXIN 250 MG PO CAPS: 250.0000 mg | ORAL_CAPSULE | Freq: Four times a day (QID) | ORAL | Status: DC

## 2016-01-27 NOTE — Progress Notes (Signed)
Patient up out of bed and wondering hall, had an episode of urinary incontinence, refused to return to room and get back in bed for safety. Sitter at patients side. Wife arrived to room and was able to guide patient back to room. Resting quietly in chair now with wife at bedside. VS stable unable to assess neuro status due to patient refusing to follow commands. Will continue to monitor.

## 2016-01-27 NOTE — Care Management (Addendum)
Patient presents from home and placed in observation s/p elective pacemaker insertion.  During the night he was very agitated , verbally striking out at staff and required a sitter.  He does have dementia.  It has been verbally reported "family can not manage patient."  Spoke with patient's wife and daughter and patient to discharge home.  Agreeable to home health nursing.  Wife has received consultation from [facility] dementia support program.  Wife"is not ready" for patient to go to facility. Agency preference for home health is [facility].  Agency can make initial visit 2/8.  Confirmed address and contact information.  Faxed referral and received confirmation of fax.

## 2016-01-27 NOTE — Discharge Instructions (Signed)
Resume Xarelto 01/29/2016 Pacemaker Implantation, Care After Refer to this sheet over the next few weeks. These instructions provide you with information on caring for yourself after the procedure. Your health care provider may also give you more specific instructions. Your treatment has been planned according to current medical practices, but problems sometimes occur. Call your health care provider if you have any problems or questions regarding your pacemaker.  WHAT TO EXPECT AFTER THE PROCEDURE  You may feel pain. Some pain is normal. It may last a few days.  A slight bump may be seen over the skin where the device was placed. Sometimes, it is possible to feel the device under the skin. This is normal.  In the months and years afterward, your health care provider will check the device, the leads, and the battery every few months. Eventually, when the battery is low, the device will be replaced. HOME CARE INSTRUCTIONS Medicines  Take medicines only as directed by your health care provider.  If you were prescribed an antibiotic medicine, finish it all even if you start to feel better.  Do not take any other medicines without asking your health care provider first. Some medicines, including certain painkillers, can cause bleeding in your stomach after surgery. Wound Care  Do not remove the bandage on your chest until directed to do so by your health care provider.  After your bandage is removed, you may see pieces of tape called skin adhesive strips over the area where the cut was made (incision site). Let them fall off on their own.  Check the incision site every day to make sure it is not infected, bleeding, or starting to pull apart.  Do not use lotions or ointments near the incision site unless directed to do so.  Keep the incision area clean and dry for 2-3 days after the procedure or as directed by your health care provider. It takes several weeks for the incision site to completely  heal.  Do not take baths, swim, or use a hot tub until your health care provider approves. Activities  Try to walk a little every day. Exercising is important after this procedure. It is also important to use your shoulder on the side of the pacemaker in daily tasks that do not require exaggerated motion.  Avoid sudden jerking, pulling, or chopping movements that pull your upper arm far away from your body for at least 6 weeks.  Do not lift your upper arm above your shoulders for at least 6 weeks. This means no tennis, golf, or swimming for this period of time. If you sleep with the arm above your head, use a restraint to prevent this from happening as you sleep.  You may go back to work when your health care provider says it is okay. Check with your health care provider before you start to drive or play sports. Other Instructions  Follow diet instructions if they were provided. You should be able to eat what you usually do right away, but you may need to limit your salt intake.  Weigh yourself every day. If you suddenly gain weight, fluid may be building up in your body.  Always carry your pacemaker identification card with you. The card should list the implant date, device model, and manufacturer. Consider wearing a medical alert bracelet or necklace.  Tell all health care providers that you have a pacemaker. This may prevent them from giving you a magnetic resource imaging scan (MRI) because of the strong magnets used  during that test.  If you must pass through a metal detector, quickly walk through it. Do not stop under the detector or stand near it.  Avoid places or objects with a strong electric or magnetic field, including:  Engineer, maintenance. When at the airport, let officials know you have a pacemaker. Your ID card will let you be checked in a way that is safe for you and that will not damage your pacemaker. Also, do not let a security person wave a magnetic wand near your  pacemaker. That can make it stop working.  Power plants.  Large electrical generators.  Radiofrequency transmission towers, such as cell phone and radio towers.  Do not use amateur (ham) radio equipment or electric (arc) welding torches. Some devices are safe to use if held at least 1 foot from your pacemaker. These include power tools, lawn mowers, and speakers. If you are unsure of whether something is safe to use, ask your health care provider.  You may safely use electric blankets, heating pads, computers, and microwave ovens.  When using your cell phone, hold it to the ear opposite the pacemaker. Do not leave your cell phone in a pocket over the pacemaker.  Keep all follow-up visits as directed by your health care provider. This is how your health care provider makes sure your chest is healing the way it should. Ask your health care provider when you should come back to have your stitches or staples taken out.  Have your pacemaker checked every 3-6 months or as directed by your health care provider. Most pacemakers last for 4-8 years before a new one is needed. SEEK MEDICAL CARE IF:  You gain weight suddenly.  Your legs or feet swell more than they have before.  It feels like your heart is fluttering or skipping beats (heart palpitations).  You have a fever. SEEK IMMEDIATE MEDICAL CARE IF:  You have chest pain.  You feel more short of breath than you have felt before.  You feel more light-headed than you have felt before.  You have problems with your incision site, such as swelling or bleeding, or it starts to open up.  You have drainage, redness, swelling, or pain at your incision site.   This information is not intended to replace advice given to you by your health care provider. Make sure you discuss any questions you have with your health care provider.   Document Released: 06/25/2005 Document Revised: 12/27/2014 Document Reviewed: 04/07/2012 Elsevier Interactive  Patient Education [employer].

## 2016-01-27 NOTE — Progress Notes (Signed)
Patient wife requesting to take patient home. Stated that she can care for him. Dr Saralyn Pilar notified of patients and patients family wishes for discharge

## 2016-01-27 NOTE — Progress Notes (Signed)
Patient discharged via wheelchair and private vehicle. IV removed and catheter intact. All discharge instructions given and patient verbalizes understanding. Tele removed and returned. prescriptions given to patient No distress noted.    

## 2016-01-27 NOTE — Clinical Social Work Note (Signed)
Clinical Social Work Assessment  Patient Details  Name: Steve Aguirre MRN: [id number] Date of Birth: Mar 21, 1927  Date of referral:  01/27/16               Reason for consult:  Discharge Planning                Permission sought to share information with:  Family Supports Permission granted to share information::  Yes, Verbal Permission Granted  Name::        Agency::     Relationship::   Ned Grace (Wife) )  Contact Information:     Housing/Transportation Living arrangements for the past 2 months:  Single Family Home Source of Information:  Spouse Patient Interpreter Needed:  None Criminal Activity/Legal Involvement Pertinent to Current Situation/Hospitalization:  No - Comment as needed Significant Relationships:  Adult Children, Spouse Lives with:  Spouse Do you feel safe going back to the place where you live?  Yes Need for family participation in patient care:  Yes (Comment) Lonzo Saulter (Wife) )  Care giving concerns: Patient's wife and daughter has concerns about patient's worsening "deminta" and being aggressive.    Social Worker assessment / plan:  CSW was consulted due to patient becoming aggressive with family and staff yesterday. CSW met with patient and his wife. Patient was using the bathroom and his wife was standing beside the sink. CSW explained her role. Per patient's wife she feels that patient can go home. She stated that patient was having a "difficult" time adjusting to the hospital yesterday and that he "does not act this way with me". She reports that she has assistance at home that helps her and patient. Patient's wife reports that patient is getting "aggitated" because he's ready to go home. CSW informed patient and his wife to contact her if discharge plans were to change.   CSW was stopped in the hall by patient's RN. She reports that patient's wife and daughter wanted to discuss discharge plans. CSW met with patient's wife and daughter outside of  patient's room per their request. Patient's daughter discussed that patient's behaviors make her feel "unsafe" with taking patient home. She reports that patient "got aggressive" yesterday. CSW explained patient's Medicare benefits to patient's wife and daughter. CSW discussed patient's "observation" status and how it is a barrier to SNF placement. CSW explained private pay SNF ($8,000-7,000 per month) and ALF ($7,000-6,000 per month). Patient's wife and daughter reported that they aren't able to pay this fee. CSW encouraged patient's wife and daughter to apply for Medicaid through [place]. CSW explained that this can be a 30-45+ day process. Provided [place] information and website for further assistance. CSW inquired if patient's wife and daughter were interested in private sitters for patient. They declined. Patient's wife and mother agreed to take patient home at discharge.   There are no other CSW needs at this time. CSW is available if a need were to arise.   Employment status:    Insurance information:  Medicare PT Recommendations:  Not assessed at this time Information / Referral to community resources:     Patient/Family's Response to care:  Patient and his family are agreeable with patient returning home at discharge.   Patient/Family's Understanding of and Emotional Response to Diagnosis, Current Treatment, and Prognosis:  Patient and his family understands CSW's role. They were appreciative to CSW's assistance and thanked her for providing information for [facility] DSS/ [place].   Emotional Assessment  Appearance:  Appears stated age Attitude/Demeanor/Rapport:   (None ) Affect (typically observed):  Calm Orientation:  Oriented to Self, Oriented to Place, Oriented to  Time, Oriented to Situation Alcohol / Substance use:  Not Applicable Psych involvement (Current and /or in the community):  No (Comment)  Discharge Needs  Concerns  to be addressed:  Discharge Planning Concerns Readmission within the last 30 days:    Current discharge risk:  None Barriers to Discharge:  No Barriers Identified   [place], LCSW 01/27/2016, 11:58 AM
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
## 2016-02-03 ENCOUNTER — Emergency Department: Payer: Medicare Other

## 2016-02-03 ENCOUNTER — Encounter: Payer: Self-pay | Admitting: Emergency Medicine

## 2016-02-03 ENCOUNTER — Emergency Department
Admission: EM | Admit: 2016-02-03 | Discharge: 2016-02-03 | Disposition: A | Discharge status: Home / Self Care | Payer: Medicare Other | Attending: Emergency Medicine | Admitting: Emergency Medicine

## 2016-02-03 DIAGNOSIS — S0081XA Abrasion of other part of head, initial encounter: Secondary | ICD-10-CM | POA: Insufficient documentation

## 2016-02-03 DIAGNOSIS — W1839XA Other fall on same level, initial encounter: Secondary | ICD-10-CM | POA: Diagnosis not present

## 2016-02-03 DIAGNOSIS — Z87891 Personal history of nicotine dependence: Secondary | ICD-10-CM | POA: Insufficient documentation

## 2016-02-03 DIAGNOSIS — I10 Essential (primary) hypertension: Secondary | ICD-10-CM | POA: Diagnosis not present

## 2016-02-03 DIAGNOSIS — S63251A Unspecified dislocation of left index finger, initial encounter: Secondary | ICD-10-CM | POA: Diagnosis not present

## 2016-02-03 DIAGNOSIS — Y998 Other external cause status: Secondary | ICD-10-CM | POA: Insufficient documentation

## 2016-02-03 DIAGNOSIS — F0390 Unspecified dementia without behavioral disturbance: Secondary | ICD-10-CM | POA: Insufficient documentation

## 2016-02-03 DIAGNOSIS — S63259A Unspecified dislocation of unspecified finger, initial encounter: Secondary | ICD-10-CM

## 2016-02-03 DIAGNOSIS — Y9289 Other specified places as the place of occurrence of the external cause: Secondary | ICD-10-CM | POA: Diagnosis not present

## 2016-02-03 DIAGNOSIS — Y9389 Activity, other specified: Secondary | ICD-10-CM | POA: Diagnosis not present

## 2016-02-03 DIAGNOSIS — S61219A Laceration without foreign body of unspecified finger without damage to nail, initial encounter: Secondary | ICD-10-CM

## 2016-02-03 DIAGNOSIS — S61211A Laceration without foreign body of left index finger without damage to nail, initial encounter: Secondary | ICD-10-CM | POA: Insufficient documentation

## 2016-02-03 DIAGNOSIS — Z79899 Other long term (current) drug therapy: Secondary | ICD-10-CM | POA: Diagnosis not present

## 2016-02-03 DIAGNOSIS — S61412A Laceration without foreign body of left hand, initial encounter: Secondary | ICD-10-CM

## 2016-02-03 DIAGNOSIS — Z23 Encounter for immunization: Secondary | ICD-10-CM | POA: Insufficient documentation

## 2016-02-03 DIAGNOSIS — S63104A Unspecified dislocation of right thumb, initial encounter: Secondary | ICD-10-CM | POA: Diagnosis not present

## 2016-02-03 DIAGNOSIS — IMO0001 Reserved for inherently not codable concepts without codable children: Secondary | ICD-10-CM

## 2016-02-03 DIAGNOSIS — S61011A Laceration without foreign body of right thumb without damage to nail, initial encounter: Secondary | ICD-10-CM | POA: Diagnosis not present

## 2016-02-03 DIAGNOSIS — S63005A Unspecified dislocation of left wrist and hand, initial encounter: Secondary | ICD-10-CM

## 2016-02-03 LAB — CBC WITH DIFFERENTIAL/PLATELET
Basophils Absolute: 0 10*3/uL (ref 0–0.1)
Basophils Relative: 0 %
Eosinophils Absolute: 0.2 10*3/uL (ref 0–0.7)
Eosinophils Relative: 2 %
HEMATOCRIT: 39.9 % — AB (ref 40.0–52.0)
HEMOGLOBIN: 13.3 g/dL (ref 13.0–18.0)
LYMPHS ABS: 1.5 10*3/uL (ref 1.0–3.6)
LYMPHS PCT: 16 %
MCH: 31 pg (ref 26.0–34.0)
MCHC: 33.3 g/dL (ref 32.0–36.0)
MCV: 93 fL (ref 80.0–100.0)
MONO ABS: 0.6 10*3/uL (ref 0.2–1.0)
MONOS PCT: 7 %
NEUTROS ABS: 6.8 10*3/uL — AB (ref 1.4–6.5)
NEUTROS PCT: 75 %
Platelets: 200 10*3/uL (ref 150–440)
RBC: 4.29 MIL/uL — ABNORMAL LOW (ref 4.40–5.90)
RDW: 13.8 % (ref 11.5–14.5)
WBC: 9.1 10*3/uL (ref 3.8–10.6)

## 2016-02-03 LAB — BASIC METABOLIC PANEL
ANION GAP: 9 (ref 5–15)
BUN: 22 mg/dL — ABNORMAL HIGH (ref 6–20)
CHLORIDE: 106 mmol/L (ref 101–111)
CO2: 24 mmol/L (ref 22–32)
Calcium: 9.3 mg/dL (ref 8.9–10.3)
Creatinine, Ser: 1.4 mg/dL — ABNORMAL HIGH (ref 0.61–1.24)
GFR calc Af Amer: 51 mL/min — ABNORMAL LOW (ref >60)
GFR calc non Af Amer: 44 mL/min — ABNORMAL LOW (ref >60)
GLUCOSE: 127 mg/dL — AB (ref 65–99)
POTASSIUM: 3.8 mmol/L (ref 3.5–5.1)
Sodium: 139 mmol/L (ref 135–145)

## 2016-02-03 LAB — PROTIME-INR
INR: 1.31
Prothrombin Time: 16.4 seconds — ABNORMAL HIGH (ref 11.4–15.0)

## 2016-02-03 LAB — APTT: aPTT: 59 seconds — ABNORMAL HIGH (ref 24–36)

## 2016-02-03 MED ORDER — TETANUS-DIPHTH-ACELL PERTUSSIS 5-2.5-18.5 LF-MCG/0.5 IM SUSP
0.5000 mL | Freq: Once | INTRAMUSCULAR | Status: AC
  Administered 2016-02-03: 0.5 mL via INTRAMUSCULAR
  Filled 2016-02-03: qty 0.5

## 2016-02-03 MED ORDER — CEFAZOLIN SODIUM-DEXTROSE 2-3 GM-% IV SOLR
2.0000 g | Freq: Once | INTRAVENOUS | Status: AC
  Administered 2016-02-03: 2 g via INTRAVENOUS
  Filled 2016-02-03: qty 50

## 2016-02-03 MED ORDER — CEPHALEXIN 500 MG PO CAPS: 500.0000 mg | ORAL_CAPSULE | Freq: Two times a day (BID) | ORAL | Status: DC

## 2016-02-03 NOTE — ED Provider Notes (Signed)
[facility] Emergency Department Provider Note  ____________________________________________  Time seen: 4:15 PM  I have reviewed the triage vital signs and the nursing notes.   HISTORY  Chief Complaint Laceration    HPI Steve Aguirre is a 82 y.o. male brought to the ED by his family after he had a trip and fall over a concrete curb today. He put out both hands to try and catch himself and when he did he sustained lacerations to his left index finger and right thumb. He also had some minor head trauma with abrasions to his chin and left cheek. Denies any headaches vision changes numbness tingling or weakness or any other injuries.  Previously had been on Xarelto but has been off recently because he just had a pacemaker placed. Denies any chest pain or other event.   Past Medical History  Diagnosis Date  . Arthritis   . Atrial fibrillation ([place])   . Coronary artery disease   . Scarlet fever   . Hyperlipemia   . Hypertension   . Cancer [facility])     prostate     Patient Active Problem List   Diagnosis Date Noted  . Sick sinus syndrome ([place]) 01/26/2016     Past Surgical History  Procedure Laterality Date  . Cataract extraction w/ intraocular lens  implant, bilateral Bilateral   . Joint replacement      eft knee  . Coronary artery bypass graft    . Cardiac catheterization    . Prostate surgery    . Nasal sinus surgery    . Pacemaker insertion N/A 01/26/2016    Procedure: INSERTION PACEMAKER;  Surgeon: Isaias Cowman, MD;  Location: [facility] ORS;  Service: Cardiovascular;  Laterality: N/A;     Current Outpatient Rx  Name  Route  Sig  Dispense  Refill  . cephALEXin (KEFLEX) 500 MG capsule   Oral   Take 1 capsule (500 mg total) by mouth 2 (two) times daily.   20 capsule   0   . famotidine (PEPCID) 20 MG tablet   Oral   Take 20 mg by mouth daily as needed for heartburn or indigestion.         . ramipril (ALTACE) 10 MG capsule   Oral  Take 10 mg by mouth daily.         . rivaroxaban (XARELTO) 20 MG TABS tablet   Oral   Take 20 mg by mouth daily with supper.         . simvastatin (ZOCOR) 40 MG tablet   Oral   Take 40 mg by mouth daily.            Allergies Review of patient's allergies indicates no known allergies.   History reviewed. No pertinent family history.  Social History Social History  Substance Use Topics  . Smoking status: Former Research scientist (life sciences)  . Smokeless tobacco: None  . Alcohol Use: Yes     Comment: occ.    Review of Systems  Constitutional:   No fever or chills. No weight changes Eyes:   No blurry vision or double vision.  ENT:   No sore throat. Cardiovascular:   No chest pain. Respiratory:   No dyspnea or cough. Gastrointestinal:   Negative for abdominal pain, vomiting and diarrhea.  No BRBPR or melena. Genitourinary:   Negative for dysuria, urinary retention, bloody urine, or difficulty urinating. Musculoskeletal:   Bilateral hand pain Skin:   Negative for rash. Neurological:   Negative for headaches, focal weakness  or numbness. Psychiatric:  No anxiety or depression.   Endocrine:  No hot/cold intolerance, changes in energy, or sleep difficulty.  10-point ROS otherwise negative.  ____________________________________________   PHYSICAL EXAM:  VITAL SIGNS: ED Triage Vitals  Enc Vitals Group     BP 02/03/16 1606 130/80 mmHg     Pulse Rate 02/03/16 1606 89     Resp 02/03/16 1606 18     Temp 02/03/16 1606 97.8 F (36.6 C)     Temp Source 02/03/16 1606 Oral     SpO2 02/03/16 1606 99 %     Weight 02/03/16 1606 152 lb (68.947 kg)     Height 02/03/16 1606 5\' 4"  (1.626 m)     Head Cir --      Peak Flow --      Pain Score 02/03/16 1845 10     Pain Loc --      Pain Edu? --      Excl. in [place]? --     Vital signs reviewed, nursing assessments reviewed.   Constitutional:   Alert and oriented. Well appearing and in no distress. Eyes:   No scleral icterus. No conjunctival  pallor. PERRL. EOMI ENT   Head:   Normocephalic with abrasions of the left left side of the chin and the left maxilla. No bony tenderness no swelling..   Nose:   No congestion/rhinnorhea. No septal hematoma   Mouth/Throat:   MMM, no pharyngeal erythema. No peritonsillar mass. No uvula shift. No intraoral lesions or injuries   Neck:   No stridor. No SubQ emphysema. No meningismus. Hematological/Lymphatic/Immunilogical:   No cervical lymphadenopathy. Cardiovascular:   RRR. Normal and symmetric distal pulses are present in all extremities. No murmurs, rubs, or gallops. Pacemaker device palpable in the left upper chest wall, no evidence of any trauma to this area, nontender. Respiratory:   Normal respiratory effort without tachypnea nor retractions. Breath sounds are clear and equal bilaterally. No wheezes/rales/rhonchi. Gastrointestinal:   Soft and nontender. No distention. There is no CVA tenderness.  No rebound, rigidity, or guarding. Genitourinary:   deferred Musculoskeletal:   Examination of the left hand shows that there is a open dislocation of the left index finger at the PIP. I reduce this during examination. The right thumb has a 2-3 cm laceration at the base as well as a 2 cm laceration overlying the interphalangeal joint which is dislocated. Unable to reduce this during my evaluation.  There is some tenderness of the proximal left humerus but clavicles are stable and nontender, full range of motion of bilateral elbows and shoulders without any other forearm tenderness or any other evidence of injury. Bilateral lower extremities are unremarkable. No spinal tenderness. Neurologic:   Normal speech and language.  CN 2-10 normal. Motor grossly intact. No pronator drift.  Normal gait. No gross focal neurologic deficits are appreciated.  Skin:    Skin is warm, dry and intact. No rash noted.  No petechiae, purpura, or bullae. Psychiatric:   Mood and affect are normal. Speech and  behavior are normal. Patient exhibits appropriate insight and judgment.  ____________________________________________    LABS (pertinent positives/negatives) (all labs ordered are listed, but only abnormal results are displayed) Labs Reviewed  BASIC METABOLIC PANEL - Abnormal; Notable for the following:    Glucose, Bld 127 (*)    BUN 22 (*)    Creatinine, Ser 1.40 (*)    GFR calc non Af Amer 44 (*)    GFR calc Af Amer 51 (*)  All other components within normal limits  CBC WITH DIFFERENTIAL/PLATELET - Abnormal; Notable for the following:    RBC 4.29 (*)    HCT 39.9 (*)    Neutro Abs 6.8 (*)    All other components within normal limits  PROTIME-INR - Abnormal; Notable for the following:    Prothrombin Time 16.4 (*)    All other components within normal limits  APTT - Abnormal; Notable for the following:    aPTT 59 (*)    All other components within normal limits   ____________________________________________   EKG    ____________________________________________    RADIOLOGY  CT head unremarkable X-ray left humerus unremarkable X-ray left hand unremarkable. No fractures, successful reduction of dislocated left second finger PIP joint X-ray right hand shows a dorsal dislocation of the IP joint. No fracture.  ____________________________________________   PROCEDURES Reduction of dislocation Date/Time: 8:21 PM Performed by: Joni Fears, Trixy Loyola Authorized by: Carrie Mew Consent: Verbal consent obtained. Risks and benefits: risks, benefits and alternatives were discussed Consent given by: patient Required items: required blood products, implants, devices, and special equipment available Time out: Immediately prior to procedure a "time out" was called to verify the correct patient, procedure, equipment, support staff and site/side marked as required.  Patient sedated: no  Vitals: Vital signs were monitored during sedation. Patient tolerance: Patient  tolerated the procedure well with no immediate complications. Joint: Left second finger PIP joint Reduction technique: Distraction and manipulation   easily reduced on first attempt.   Reduction of dislocation Date/Time: 8:21 PM Performed by: Carrie Mew Authorized by: Carrie Mew Consent: Verbal consent obtained. Risks and benefits: risks, benefits and alternatives were discussed Consent given by: patient Required items: required blood products, implants, devices, and special equipment available Time out: Immediately prior to procedure a "time out" was called to verify the correct patient, procedure, equipment, support staff and site/side marked as required.  Patient sedated: no  Vitals: Vital signs were monitored during sedation. Patient tolerance: Patient tolerated the procedure well with no immediate complications. Joint: Right thumb IP joint Reduction technique: Distraction and manipulation   Unable to mobilize the distal phalanx of the right thumb to reposition and restore joint alignment.      ____________________________________________   INITIAL IMPRESSION / ASSESSMENT AND PLAN / ED COURSE  Pertinent labs & imaging results that were available during my care of the patient were reviewed by me and considered in my medical decision making (see chart for details).  Patient presents with bilateral open dislocations on the hands. The left index finger was relocated easily. The right thumb was initially very dusky and so I also attempted to reduce this when immediately upon initial assessment. I was able to reposition the thumb distal phalanx slightly which did restore good blood flow and warmth and perfusion to the distal fingertip, but was unable to successfully reduce it. X-ray showed there are no fractures. I discussed the case with orthopedics Dr. Rudene Christians who came into the emergency department and come principally manage the injuries including wound care and  reduction of the left thumb. I discussed the case with him and he recommends putting the patient on an oral cephalosporin antibiotic and follow up in clinic in 3 days.  No other injuries. Labs are unremarkable, no evidence of pacemaker device failure or damage.     ____________________________________________   FINAL CLINICAL IMPRESSION(S) / ED DIAGNOSES  Final diagnoses:  Dislocation of left hand joint, initial encounter  Dislocation of right thumb, initial encounter  Laceration of  finger of left hand with complication, initial encounter  Laceration of finger of right hand with tendon involvement, initial encounter      Carrie Mew, MD 02/03/16 2023

## 2016-02-03 NOTE — ED Notes (Signed)
Right thumb open deformity.

## 2016-02-03 NOTE — ED Notes (Signed)
Pt tripped on curb and fell. Open fx with deformity to left 2nd digit.

## 2016-02-03 NOTE — Op Note (Signed)
*   No surgery found *  8:00 PM  PATIENT:  Steve Aguirre  82 y.o. male  PRE-OPERATIVE DIAGNOSIS:  Open right thumb IP joint dislocation  POST-OPERATIVE DIAGNOSIS:  Same  PROCEDURE:  * No surgery found * open reduction right thumb IP dislocation  SURGEON: Laurene Footman, MD  ASSISTANTS: None  ANESTHESIA:   Digital block  EBL:     BLOOD ADMINISTERED:none  DRAINS: none   LOCAL MEDICATIONS USED:  MARCAINE    and Amount: 20 ml  SPECIMEN:  No Specimen  DISPOSITION OF SPECIMEN:  N/A  COUNTS:  NO ER procedure performed  TOURNIQUET:  * No surgery found *  IMPLANTS: None  DICTATION: .Dragon Dictation patient was seen in the emergency room and had thumb's location with laceration on the radial base of the thumb as well as over the IP joint volarly proximally 2 and a centimeters. The dislocation was irreducible and initially until the flexor tendon could be pulled around and from and then the finger couldn't be reduced. At this point the wounds had been irrigated and soaked in dilute Betadine lacerations were then repaired using 4-0 nylon in a simple interrupted manner. With good local anesthetic obtained patient did not have pain with this. Laceration on the left index finger from under another dislocation at previously then reduced by ER staff was then repaired with simple interrupted 4-0 nylon in a loose fashion to allow for drainage as proximally 3 cm in length. Dressings were applied with Adaptic 4 x 4's and Kling  PLAN OF CARE: Patient will be discharged from the ER  PATIENT DISPOSITION:  He is stable and will follow-up with me in 3 days

## 2016-02-03 NOTE — Discharge Instructions (Signed)
Finger Dislocation Finger dislocation is the displacement of bones in your finger at the joints. Most commonly, finger dislocation occurs at the proximal interphalangeal joint (the joint closest to your knuckle). Very strong, fibrous tissues (ligaments) and joint capsules connect the three bones of your fingers.  CAUSES Dislocation is caused by a forceful impact. This impact moves these bones off the joint and often tears your ligaments.  SYMPTOMS Symptoms of finger dislocation include:  Deformity of your finger.  Pain, with loss of movement. DIAGNOSIS  Finger dislocation is diagnosed with a physical exam. Often, X-ray exams are done to see if you have associated injuries, such as bone fractures. TREATMENT  Finger dislocations are treated by putting your bones back into position (reduction) either by manually moving the bones back into place or through surgery. Your finger is then kept in a fixed position (immobilized) with the use of a dressing or splint for a brief period. When your ligament has to be surgically repaired, it needs to be kept in a fixed position with a dressing or splint for 1 to 2 weeks. Because joint stiffness is a long-term complication of finger dislocation, hand exercises or physical therapy to increase the range of motion and to regain strength is usually started as soon as the ligament is healed. Exercises and therapy generally last no more than 3 months. HOME CARE INSTRUCTIONS The following measures can help to reduce pain and speed up the healing process:  Rest your injured joint. Do not move until instructed otherwise by your caregiver. Avoid activities similar to the one that caused your injury.  Apply ice to your injured joint for the first day or 2 after your reduction or as directed by your caregiver. Applying ice helps to reduce inflammation and pain.  Put ice in a plastic bag.  Place a towel between your skin and the bag.  Leave the ice on for 15-20 minutes  at a time, every 2 hours while you are awake.  Elevate your hand above your heart as directed by your caregiver to reduce swelling.  Take over-the-counter or prescription medicine for pain as your caregiver instructs you. SEEK IMMEDIATE MEDICAL CARE IF:  Your dressing or splint becomes damaged.  Your pain becomes worse rather than better.  You lose feeling in your finger, or it becomes cold and white. MAKE SURE YOU:  Understand these instructions.  Will watch your condition.  Will get help right away if you are not doing well or get worse.   This information is not intended to replace advice given to you by your health care provider. Make sure you discuss any questions you have with your health care provider.   Document Released: 12/03/2000 Document Revised: 12/27/2014 Document Reviewed: 05/02/2015 Elsevier Interactive Patient Education 2016 [place] Sutures are stitches that can be used to close wounds. Taking care of your wound properly can help to prevent pain and infection. It can also help your wound to heal more quickly. HOW TO CARE FOR YOUR SUTURED WOUND Wound Care  Keep the wound clean and dry.  If you were given a bandage (dressing), you should change it at least once per day or as directed by your health care provider. You should also change it if it becomes wet or dirty.  Keep the wound completely dry for the first 24 hours or as directed by your health care provider. After that time, you may shower or bathe. However, make sure that the wound is not  soaked in water until the sutures have been removed.  Clean the wound one time each day or as directed by your health care provider.  Wash the wound with soap and water.  Rinse the wound with water to remove all soap.  Pat the wound dry with a clean towel. Do not rub the wound.  Aftercleaning the wound, apply a thin layer of antibioticointment as directed by your health care provider. This  will help to prevent infection and keep the dressing from sticking to the wound.  Have the sutures removed as directed by your health care provider. General Instructions  Take or apply medicines only as directed by your health care provider.  To help prevent scarring, make sure to cover your wound with sunscreen whenever you are outside after the sutures are removed and the wound is healed. Make sure to wear a sunscreen of at least 30 SPF.  If you were prescribed an antibiotic medicine or ointment, finish all of it even if you start to feel better.  Do not scratch or pick at the wound.  Keep all follow-up visits as directed by your health care provider. This is important.  Check your wound every day for signs of infection. Watch for:   Redness, swelling, or pain.  Fluid, blood, or pus.  Raise (elevate) the injured area above the level of your heart while you are sitting or lying down, if possible.  Avoid stretching your wound.  Drink enough fluids to keep your urine clear or pale yellow. SEEK MEDICAL CARE IF:  You received a tetanus shot and you have swelling, severe pain, redness, or bleeding at the injection site.  You have a fever.  A wound that was closed breaks open.  You notice a bad smell coming from the wound.  You notice something coming out of the wound, such as wood or glass.  Your pain is not controlled with medicine.  You have increased redness, swelling, or pain at the site of your wound.  You have fluid, blood, or pus coming from your wound.  You notice a change in the color of your skin near your wound.  You need to change the dressing frequently due to fluid, blood, or pus draining from the wound.  You develop a new rash.  You develop numbness around the wound. SEEK IMMEDIATE MEDICAL CARE IF:  You develop severe swelling around the injury site.  Your pain suddenly increases and is severe.  You develop painful lumps near the wound or on skin  that is anywhere on your body.  You have a red streak going away from your wound.  The wound is on your hand or foot and you cannot properly move a finger or toe.  The wound is on your hand or foot and you notice that your fingers or toes look pale or bluish.   This information is not intended to replace advice given to you by your health care provider. Make sure you discuss any questions you have with your health care provider.   Document Released: 01/13/2005 Document Revised: 04/22/2015 Document Reviewed: 07/18/2013 Elsevier Interactive Patient Education [employer].

## 2016-05-30 IMAGING — CR DG HUMERUS 2V *L*
1 series · 2 of 2 positions shown · non-contrast
Comparison: None.

CLINICAL DATA: Fall with proximal left humerus tenderness

EXAM:
LEFT HUMERUS - 2+ VIEW

[Series 1: dg humerus left · 0.14mm/px · 2 of 2 slices shown]
[im 1/2]
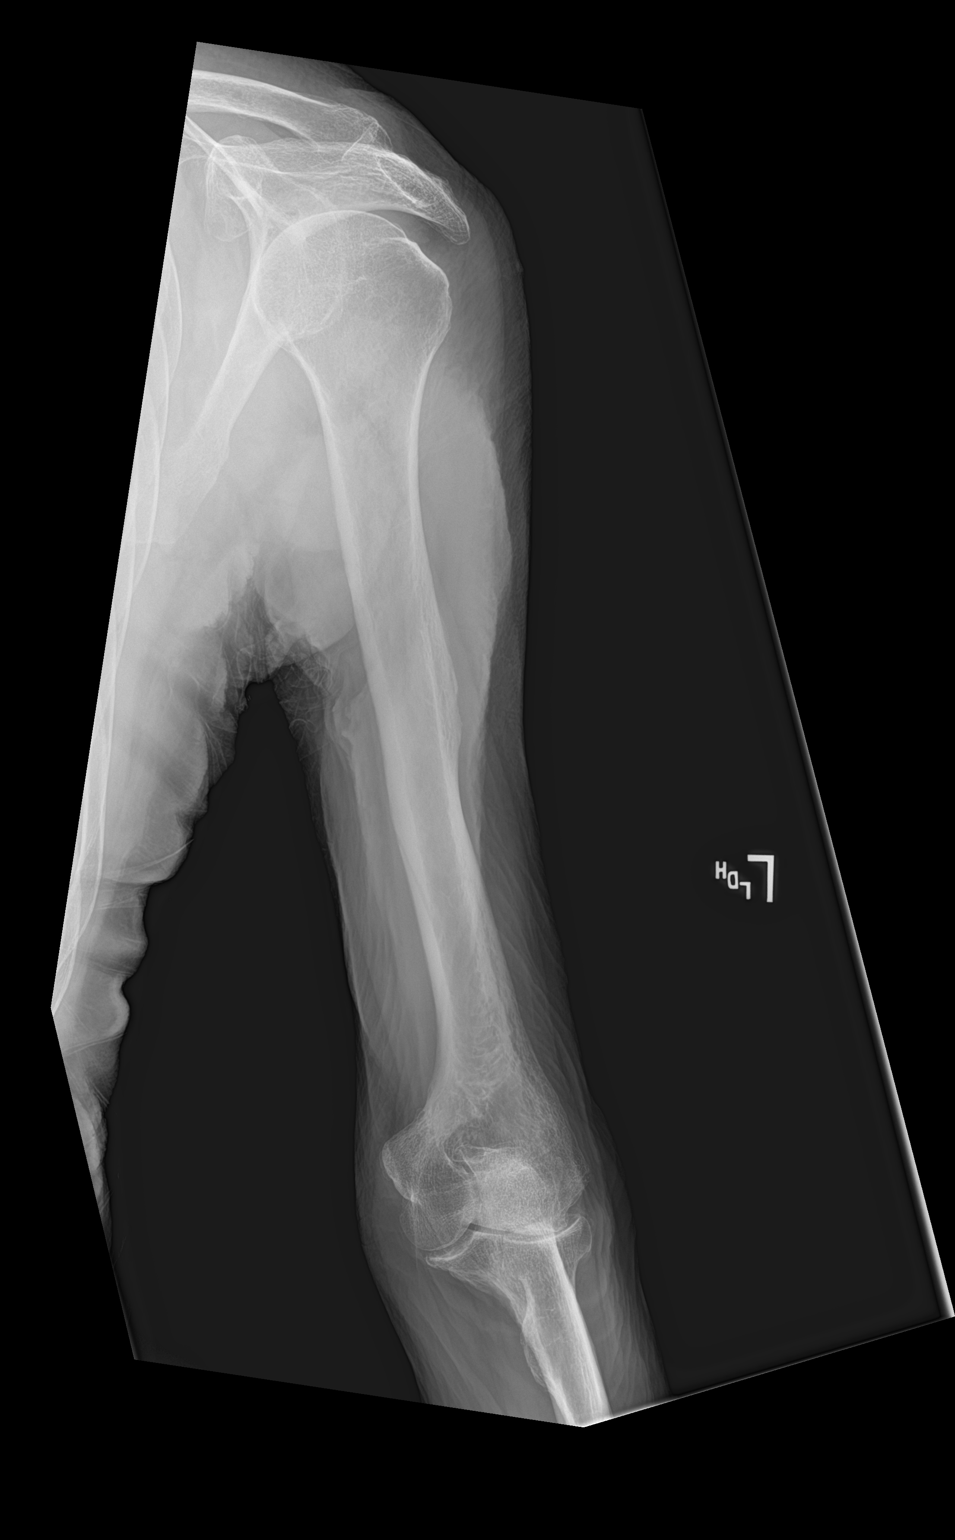
[im 2/2]
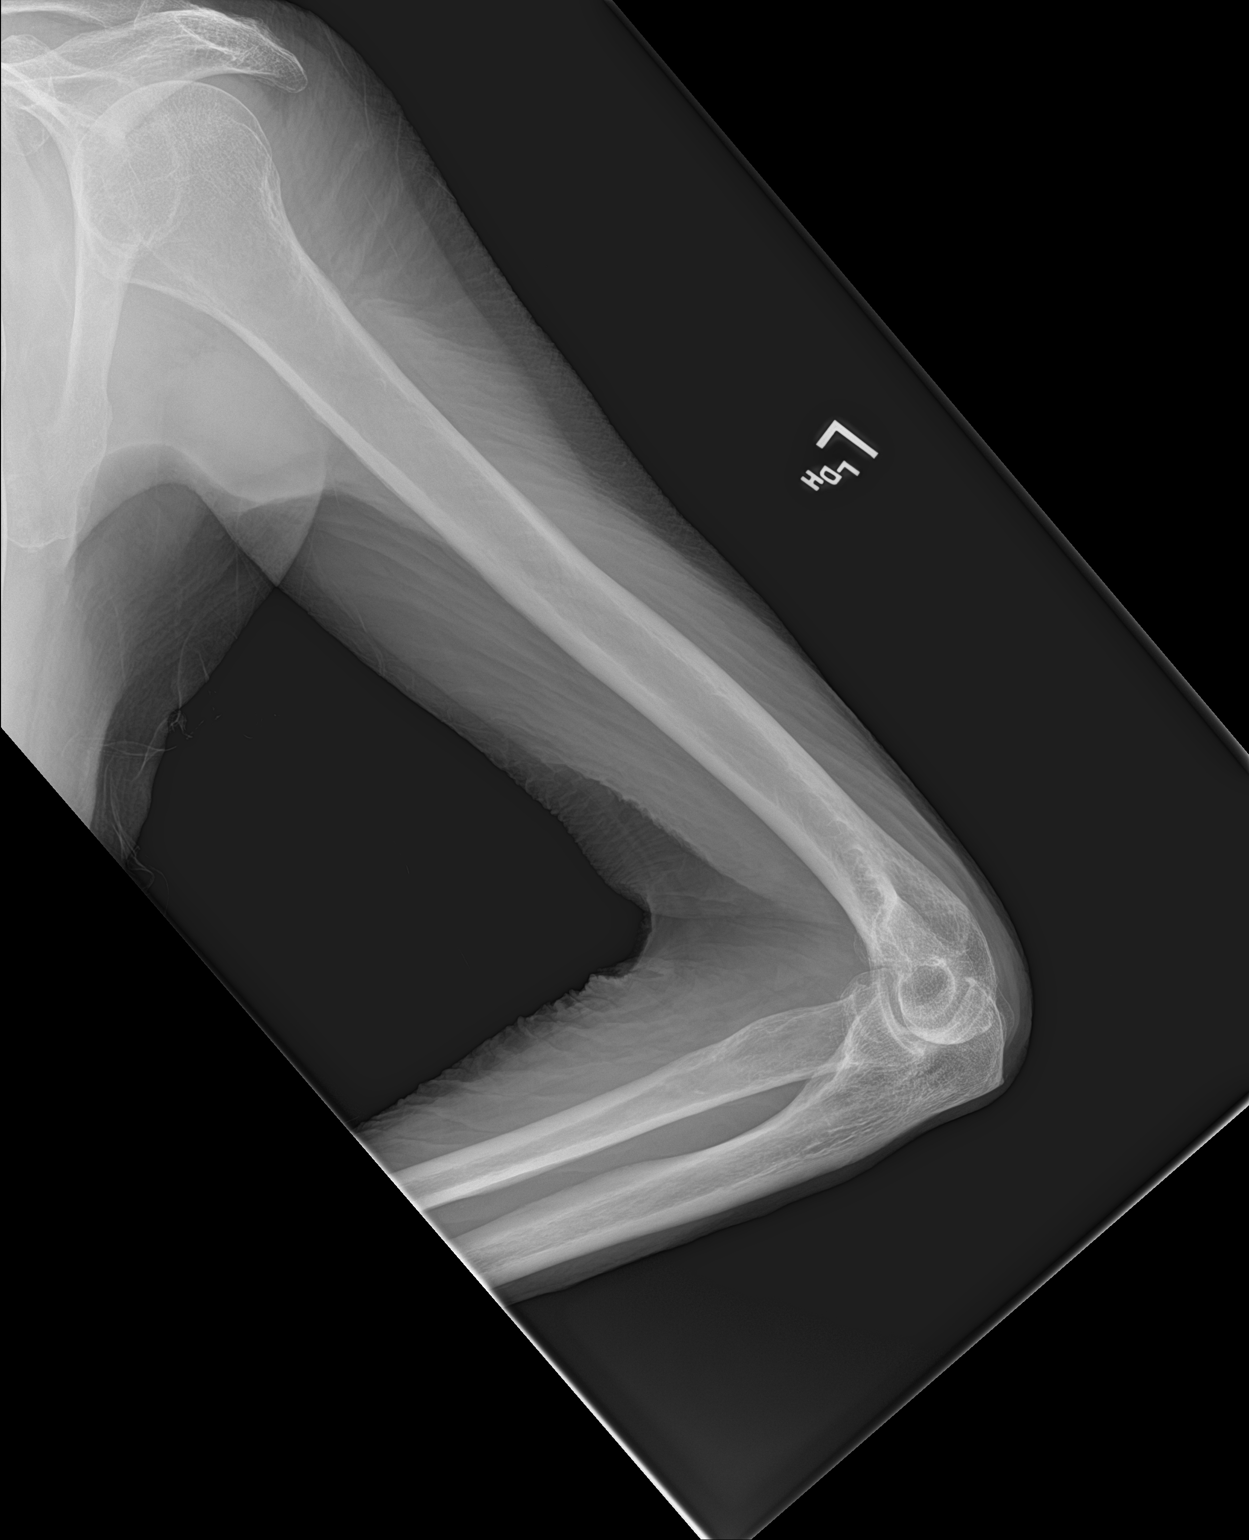

[2 of 2 positions shown; findings below may reference images not displayed]

FINDINGS: There is no evidence of fracture or other focal bone lesions. Soft
tissues are unremarkable.
IMPRESSION: Negative.

## 2016-05-30 IMAGING — CT CT HEAD W/O CM
1 series · 16 of 30 positions shown, 20 images · non-contrast
Comparison: None.

CLINICAL DATA: Status post trip and fall today.  Initial encounter.

EXAM:
CT HEAD WITHOUT CONTRAST
TECHNIQUE: Contiguous axial images were obtained from the base of the skull
through the vertex without intravenous contrast.

[Series 2: head wo · axial · 0.44mm/px · z∈[-115,+47]mm · 16 of 40 slices shown, 20 images]
[im 2/40  brain]
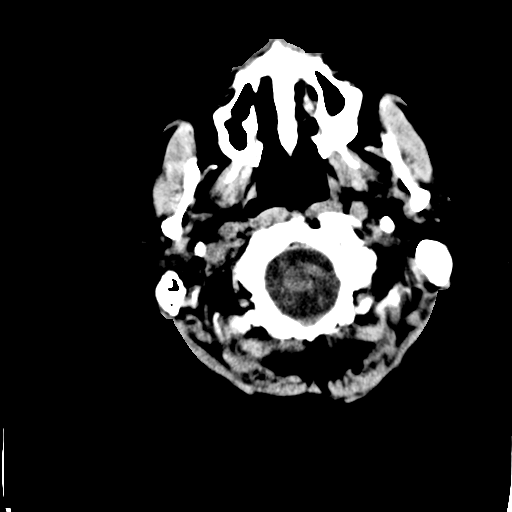
[im 2/40  bone]
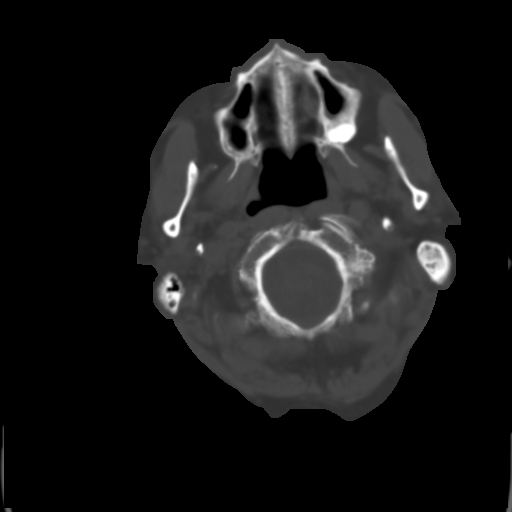
[im 5/40  brain]
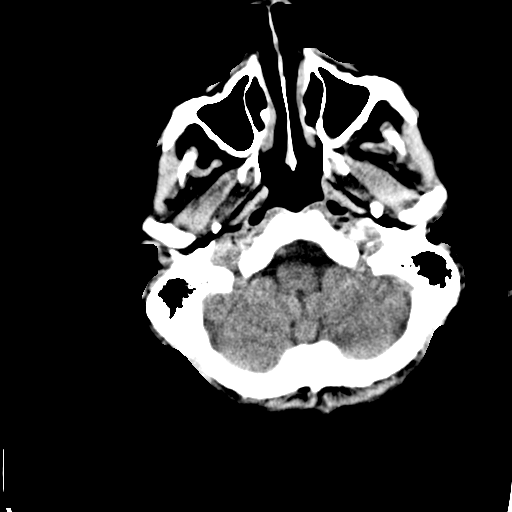
[im 7/40  brain]
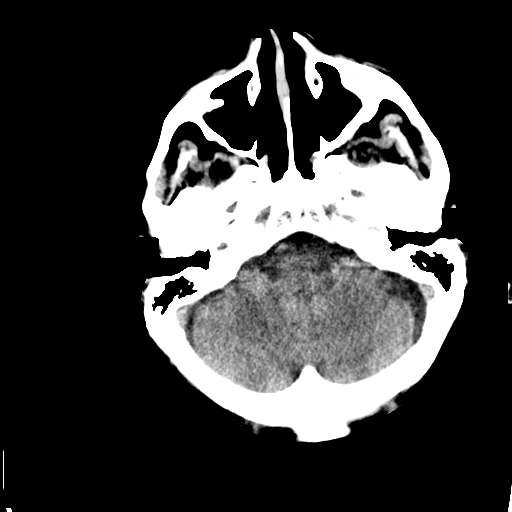
[im 10/40  brain]
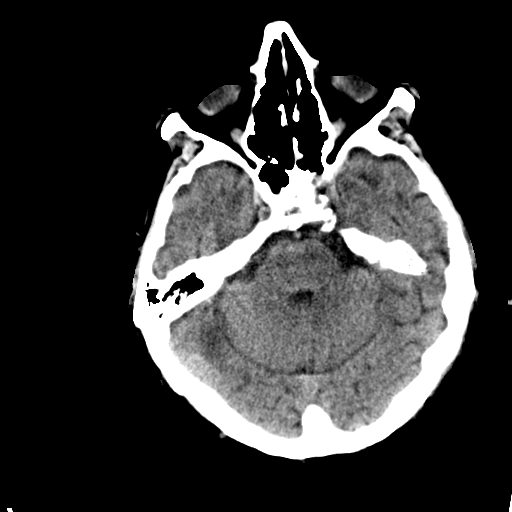
[im 11/40  brain]
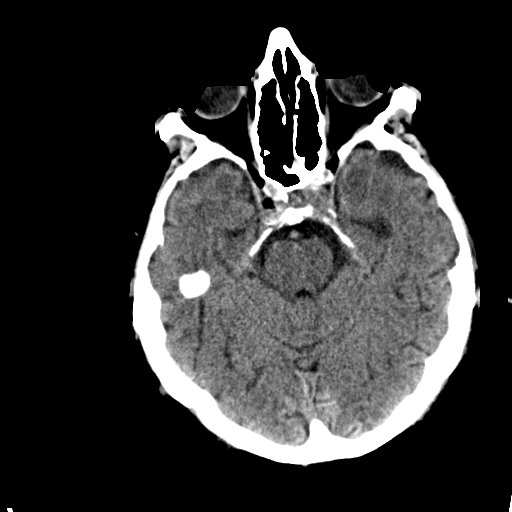
[im 11/40  bone]
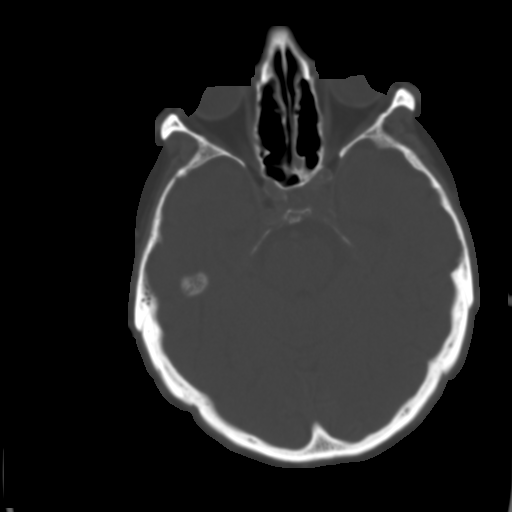
[im 14/40  brain]
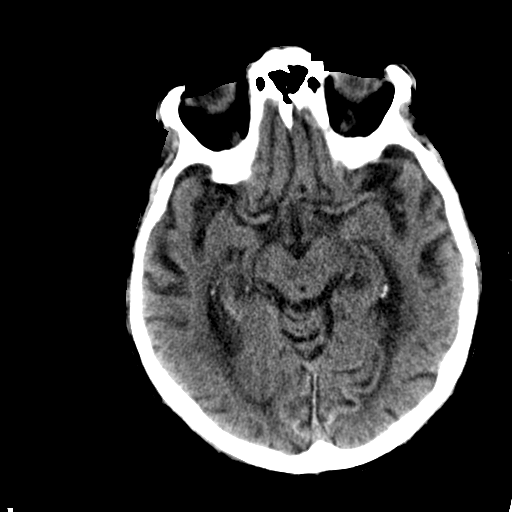
[im 17/40  brain]
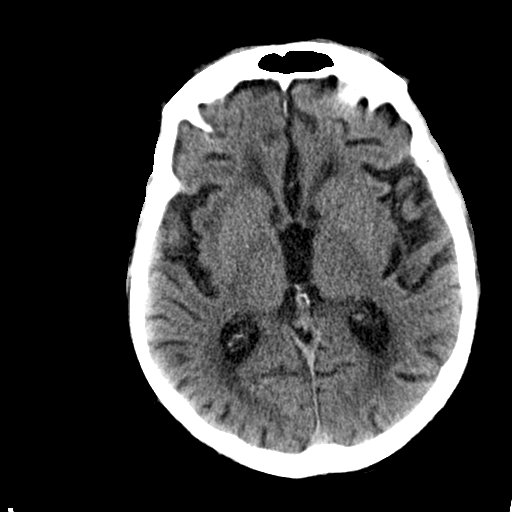
[im 19/40  brain]
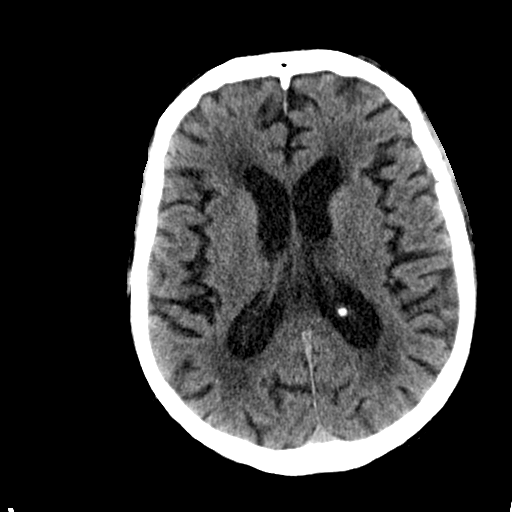
[im 21/40  brain]
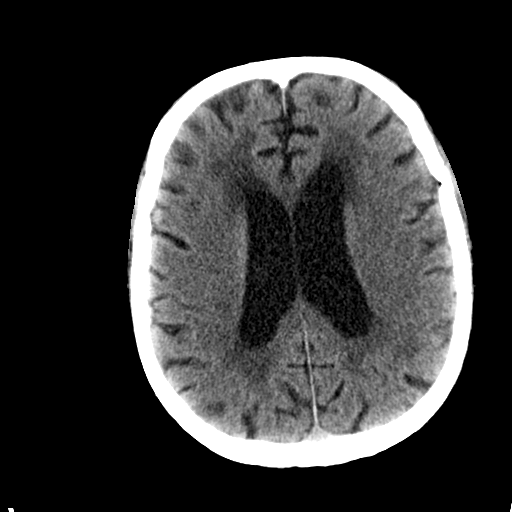
[im 21/40  bone]
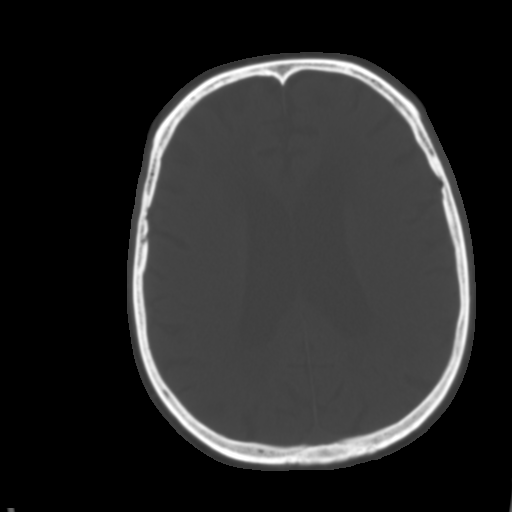
[im 23/40  brain]
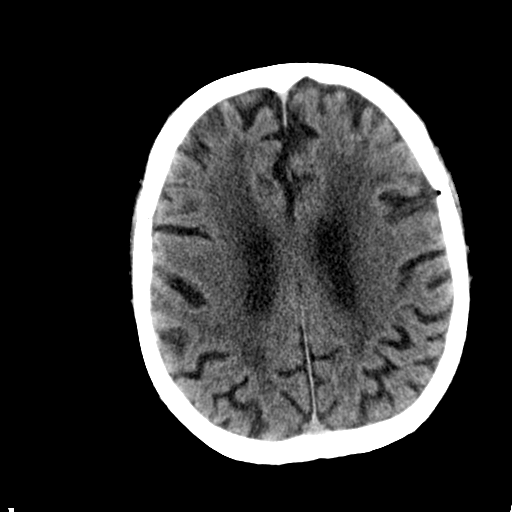
[im 26/40  brain]
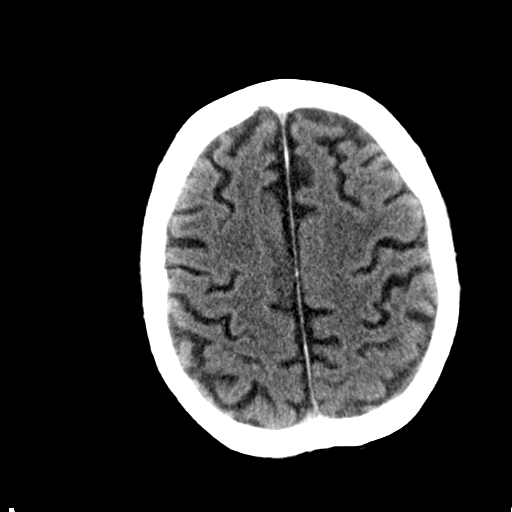
[im 29/40  brain]
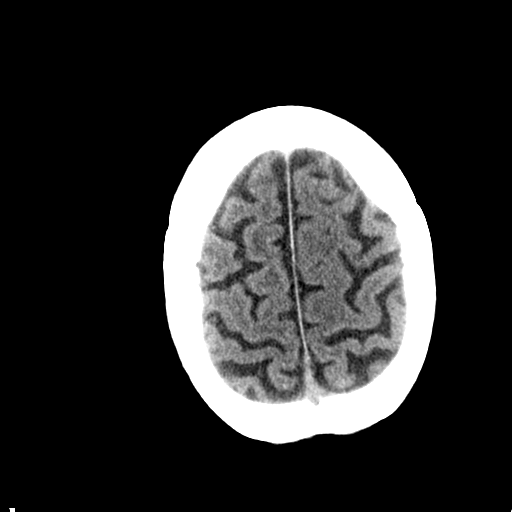
[im 30/40  brain]
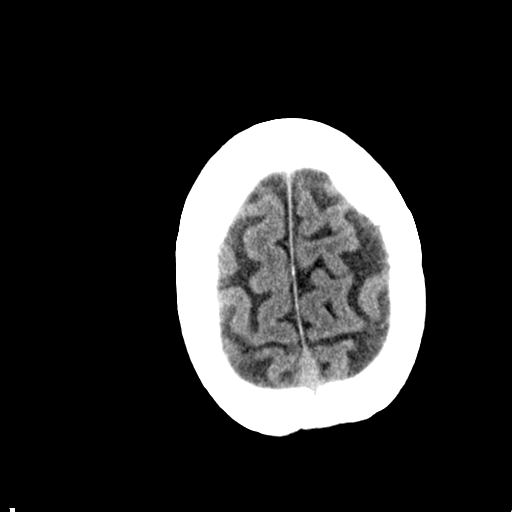
[im 30/40  bone]
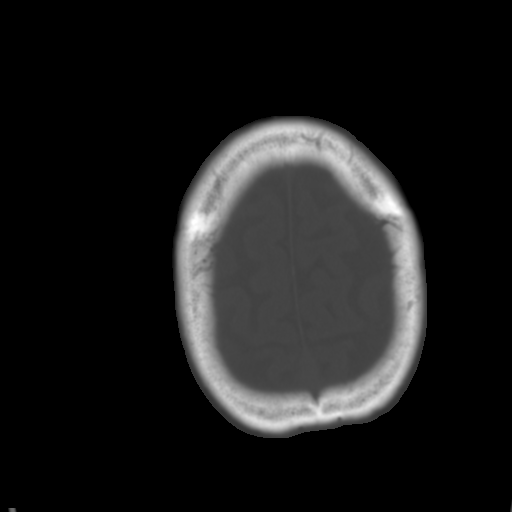
[im 33/40  brain]
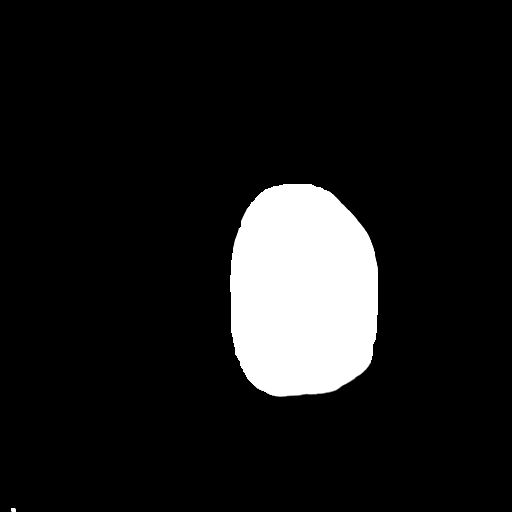
[im 35/40  brain]
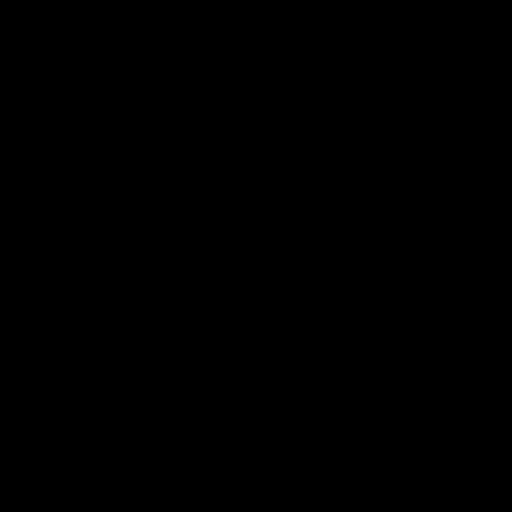
[im 38/40  brain]
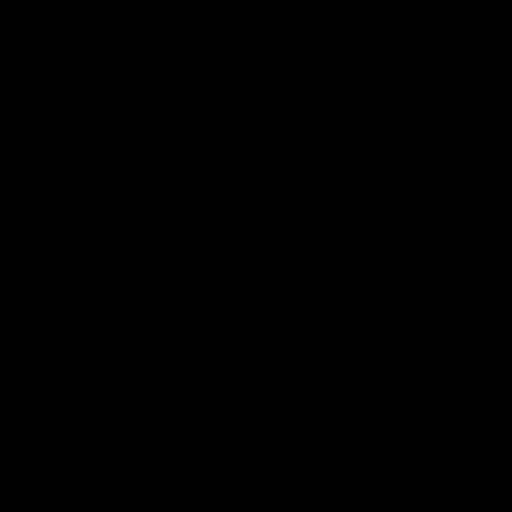

[16 of 30 positions shown; findings below may reference images not displayed]

FINDINGS: The brain is atrophic with chronic microvascular ischemic change.
Small areas of cortical hyperattenuation in the medial occipital
lobes bilaterally medially may be due to some prior insult. A 0.7 cm
hyperattenuating focus along the anterior aspect of the cervical of
[place] is worrisome for aneurysm. No hemorrhage, acute infarct, mass
lesion, mass effect, midline shift or abnormal extra-axial fluid
collection is identified. No hydrocephalus or pneumocephalus. The
calvarium is intact. The patient is status post ethmoidectomy and
bilateral maxillary antrostomy.
IMPRESSION: No acute abnormality.

Likely 0.7 cm aneurysm off the anterior communicating artery.
Nonemergent CT of the head and neck is recommended further
evaluation

## 2016-05-30 IMAGING — CR DG HAND COMPLETE 3+V*L*
1 series · 3 of 3 positions shown · non-contrast
Comparison: None.

CLINICAL DATA: Status post trip and fall today. Lacerations on both
hands. Initial encounter.

EXAM:
LEFT HAND - COMPLETE 3+ VIEW

[Series 1: dg hand complete left · 0.14mm/px · 3 of 3 slices shown]
[im 1/3]
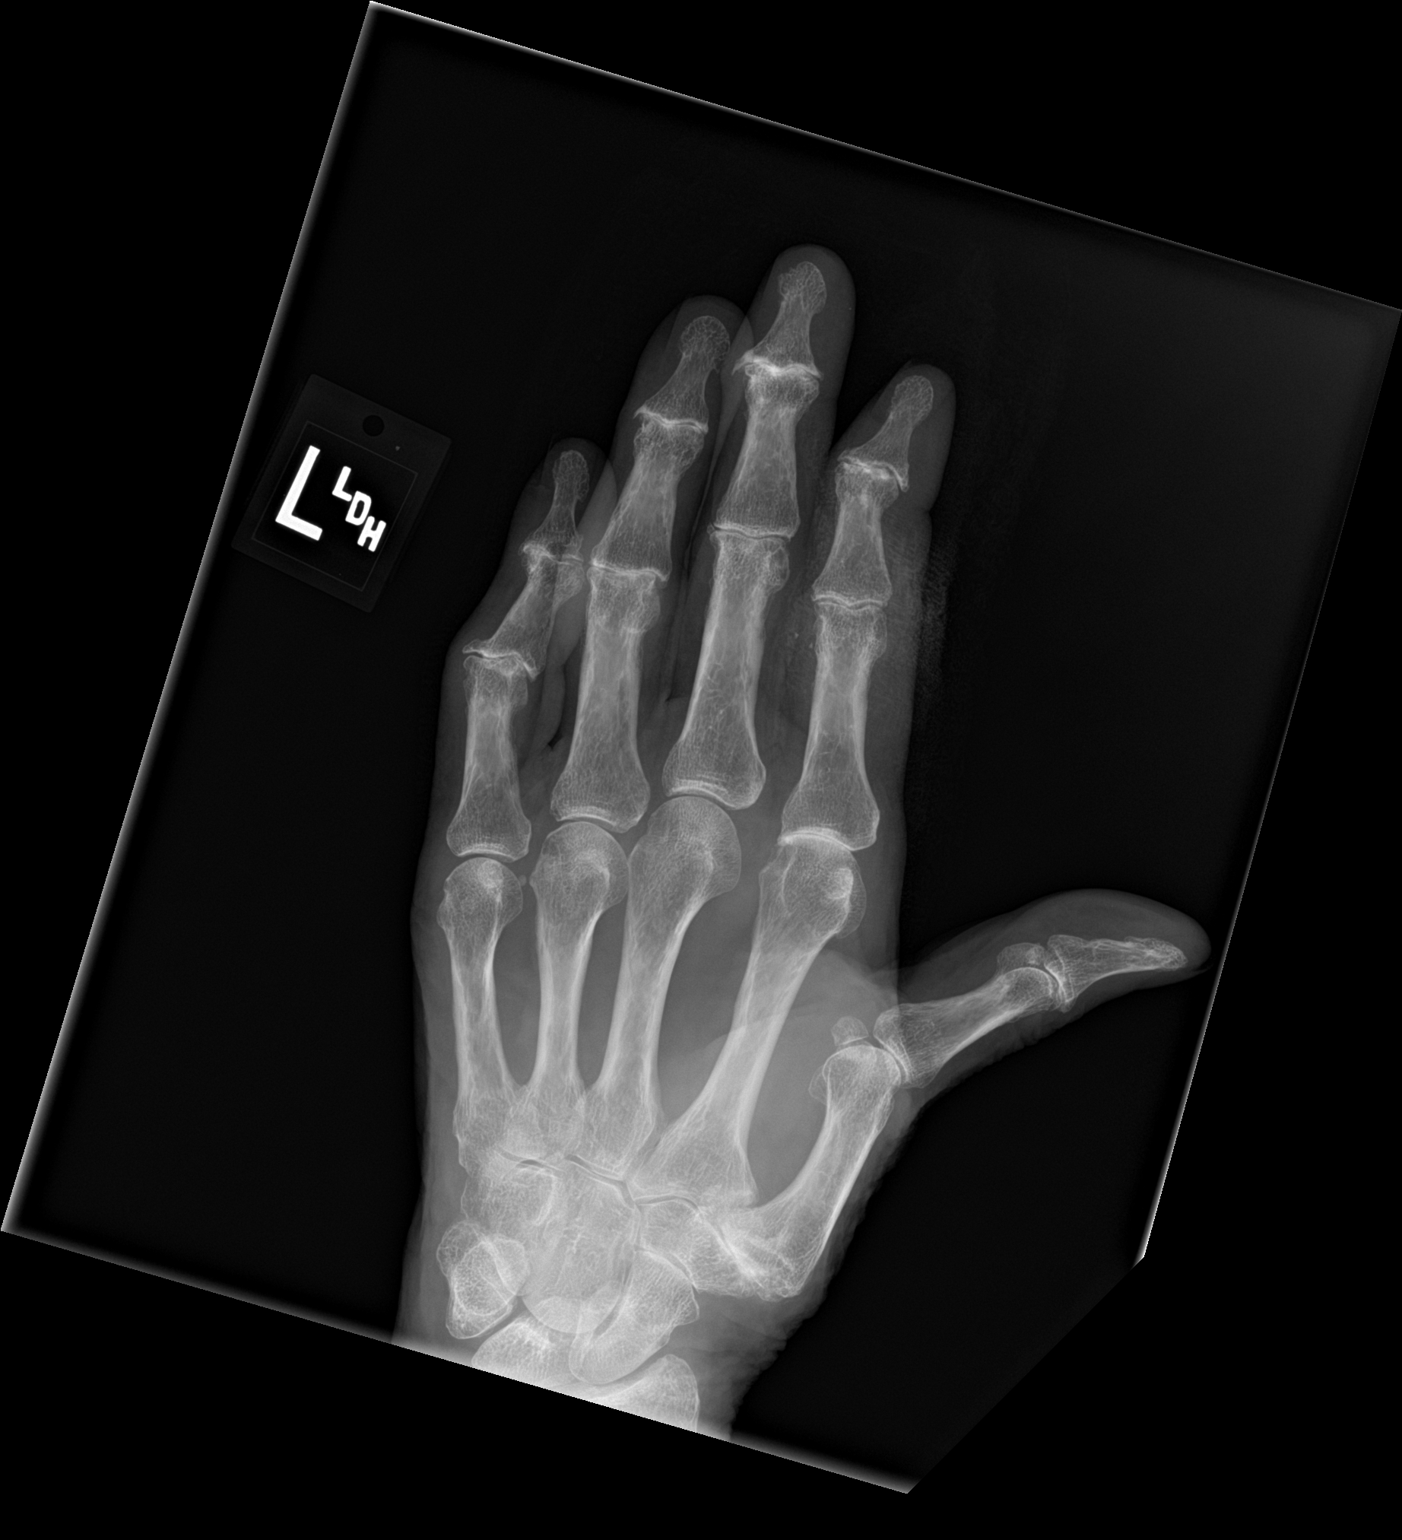
[im 2/3]
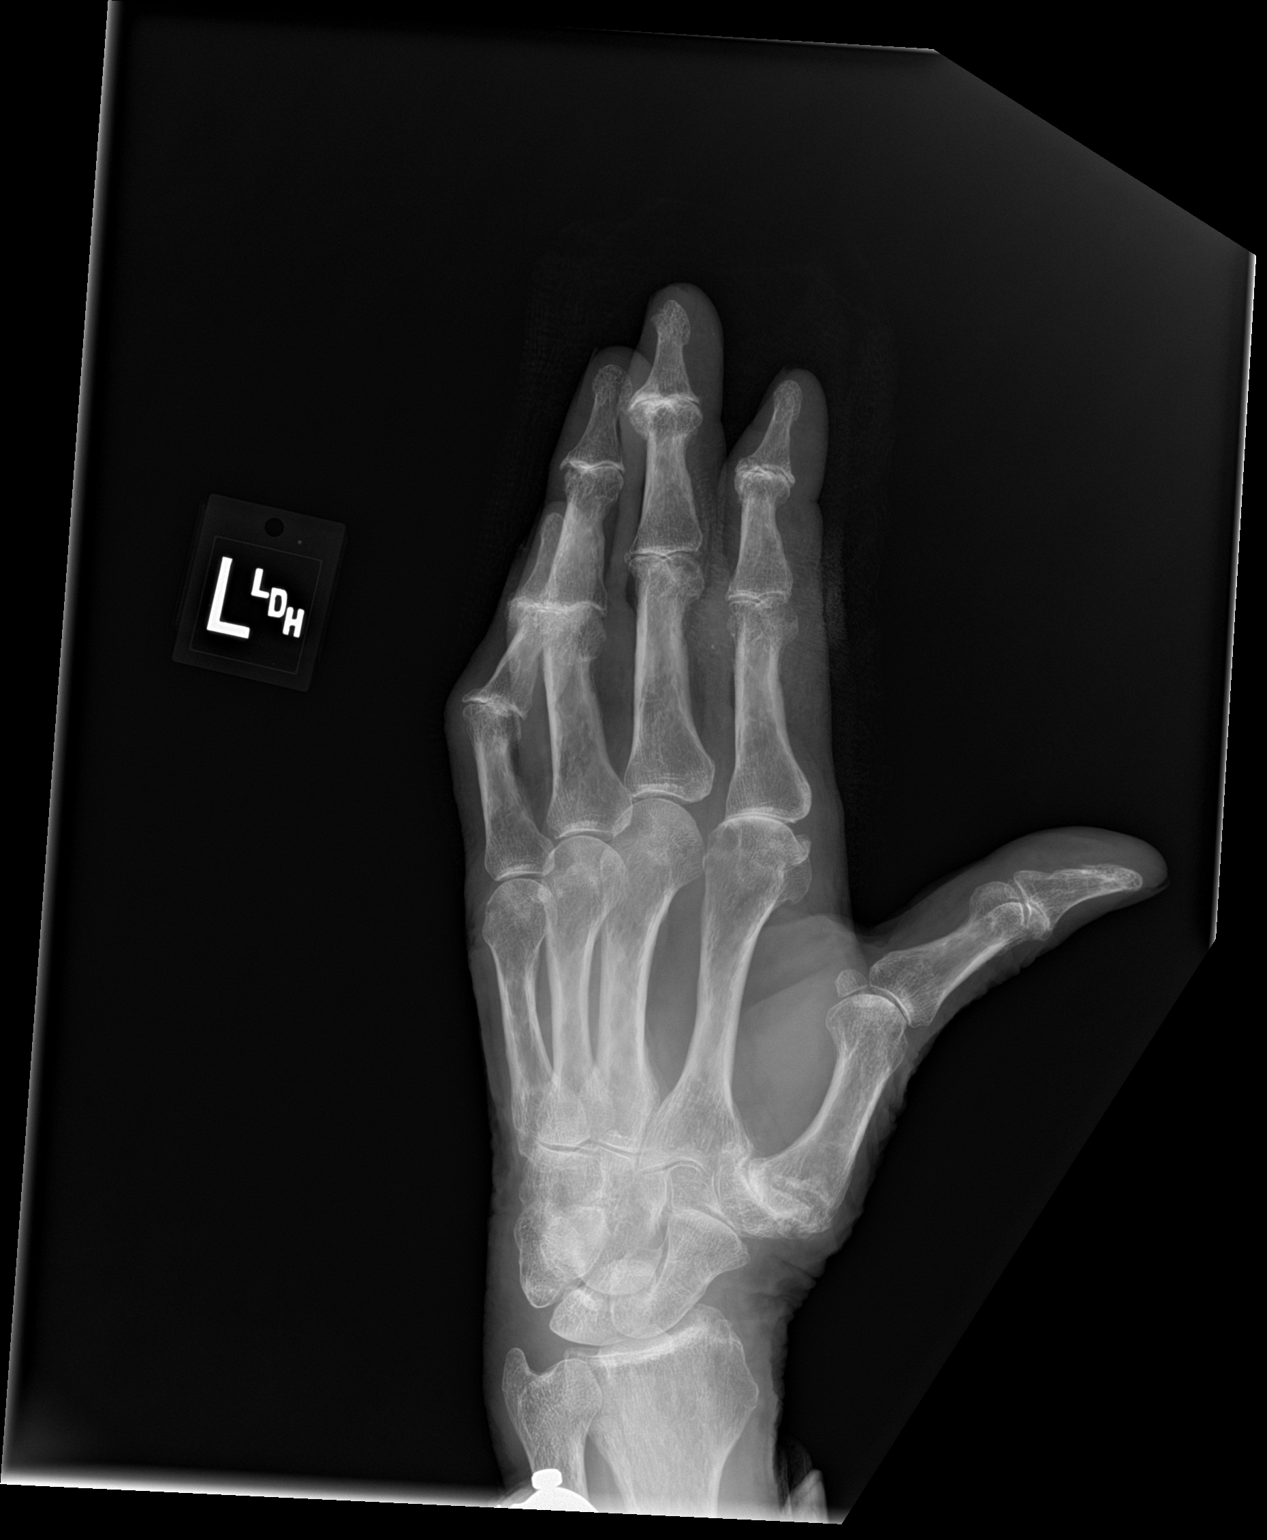
[im 3/3]
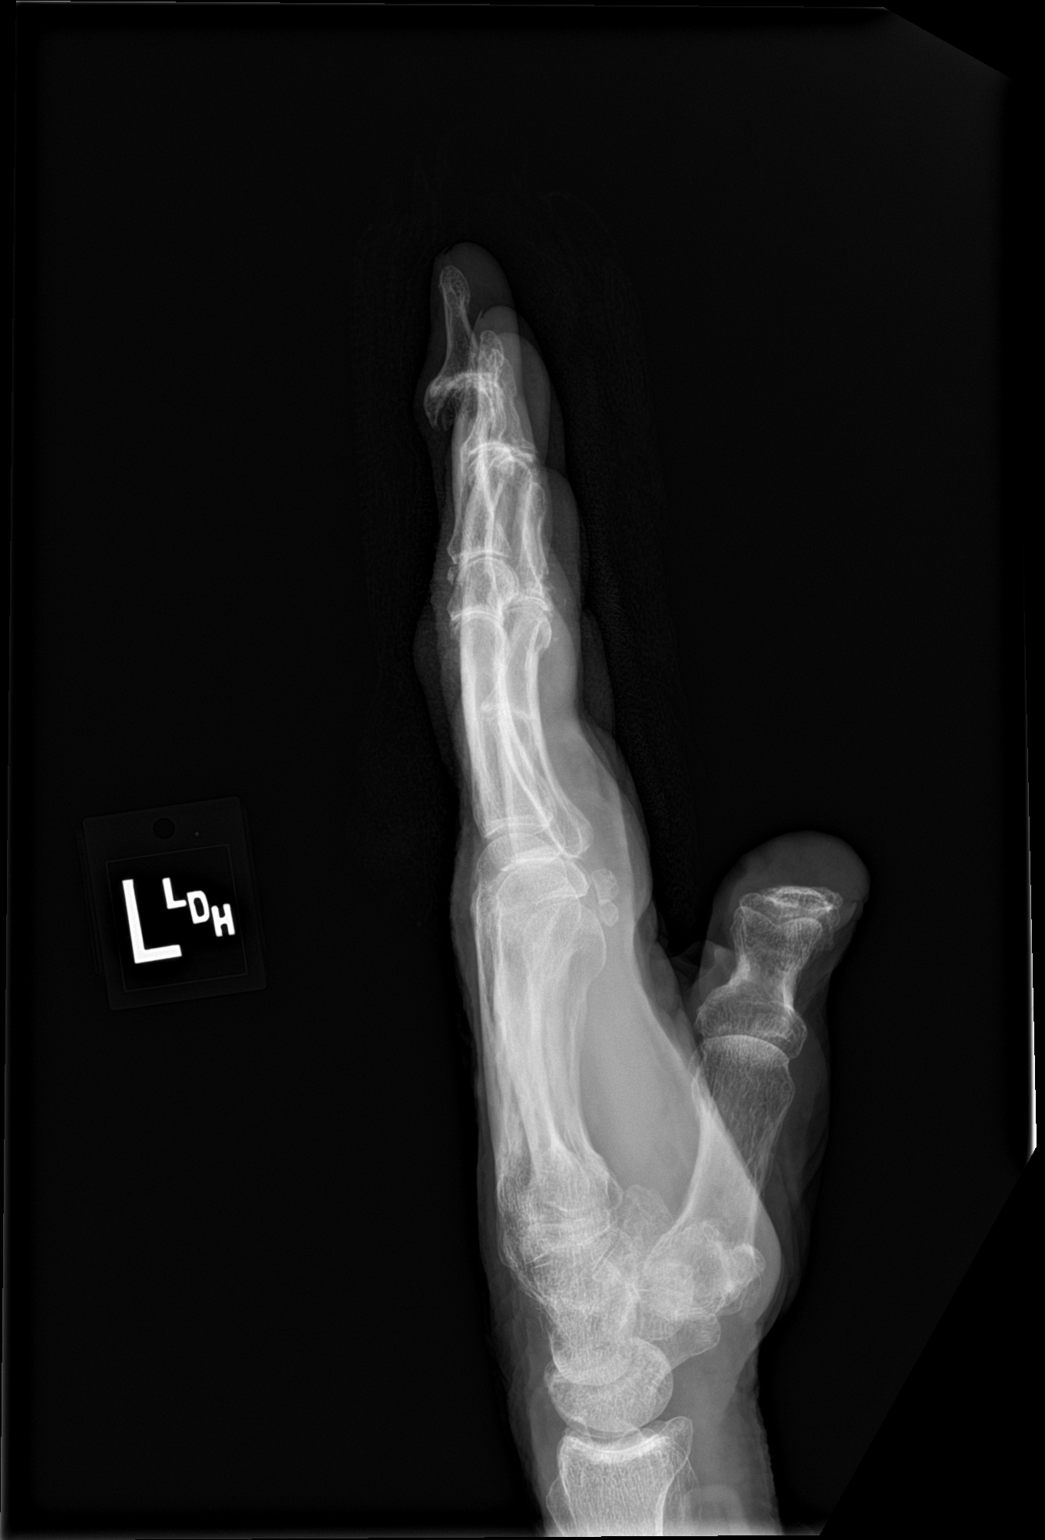

[3 of 3 positions shown; findings below may reference images not displayed]

FINDINGS: No acute bony or joint abnormality is identified. The patient has
extensive multifocal osteoarthritis involving all of the DIP joints,
the PIP joints and the first CMC joint. Bandaging is present about
the index finger. Two punctate radiopaque densities projecting along
the ulnar side of the neck of the proximal phalanx may be within the
bandage or could be in soft tissues.
IMPRESSION: Negative for fracture or dislocation.

Question foreign bodies versus material in bandaging about the left
index finger.

Multifocal osteoarthritis.

## 2016-06-18 ENCOUNTER — Encounter: Payer: Self-pay | Admitting: Emergency Medicine

## 2016-06-18 ENCOUNTER — Emergency Department
Admission: EM | Admit: 2016-06-18 | Discharge: 2016-06-18 | Disposition: A | Discharge status: Home / Self Care | Payer: Medicare Other | Attending: Emergency Medicine | Admitting: Emergency Medicine

## 2016-06-18 DIAGNOSIS — M1990 Unspecified osteoarthritis, unspecified site: Secondary | ICD-10-CM | POA: Diagnosis not present

## 2016-06-18 DIAGNOSIS — E785 Hyperlipidemia, unspecified: Secondary | ICD-10-CM | POA: Insufficient documentation

## 2016-06-18 DIAGNOSIS — Z8546 Personal history of malignant neoplasm of prostate: Secondary | ICD-10-CM | POA: Insufficient documentation

## 2016-06-18 DIAGNOSIS — Z951 Presence of aortocoronary bypass graft: Secondary | ICD-10-CM | POA: Insufficient documentation

## 2016-06-18 DIAGNOSIS — I2510 Atherosclerotic heart disease of native coronary artery without angina pectoris: Secondary | ICD-10-CM | POA: Diagnosis not present

## 2016-06-18 DIAGNOSIS — F0391 Unspecified dementia with behavioral disturbance: Secondary | ICD-10-CM | POA: Insufficient documentation

## 2016-06-18 DIAGNOSIS — Z87891 Personal history of nicotine dependence: Secondary | ICD-10-CM | POA: Diagnosis not present

## 2016-06-18 DIAGNOSIS — F918 Other conduct disorders: Secondary | ICD-10-CM | POA: Diagnosis present

## 2016-06-18 DIAGNOSIS — I10 Essential (primary) hypertension: Secondary | ICD-10-CM | POA: Diagnosis not present

## 2016-06-18 DIAGNOSIS — I4891 Unspecified atrial fibrillation: Secondary | ICD-10-CM | POA: Insufficient documentation

## 2016-06-18 DIAGNOSIS — Z79899 Other long term (current) drug therapy: Secondary | ICD-10-CM | POA: Insufficient documentation

## 2016-06-18 LAB — SALICYLATE LEVEL

## 2016-06-18 LAB — COMPREHENSIVE METABOLIC PANEL
ALK PHOS: 49 U/L (ref 38–126)
ALT: 17 U/L (ref 17–63)
AST: 23 U/L (ref 15–41)
Albumin: 3.9 g/dL (ref 3.5–5.0)
Anion gap: 8 (ref 5–15)
BUN: 23 mg/dL — AB (ref 6–20)
CO2: 23 mmol/L (ref 22–32)
CREATININE: 1.21 mg/dL (ref 0.61–1.24)
Calcium: 8.7 mg/dL — ABNORMAL LOW (ref 8.9–10.3)
Chloride: 107 mmol/L (ref 101–111)
GFR, EST NON AFRICAN AMERICAN: 52 mL/min — AB (ref >60)
Glucose, Bld: 131 mg/dL — ABNORMAL HIGH (ref 65–99)
Potassium: 4.2 mmol/L (ref 3.5–5.1)
SODIUM: 138 mmol/L (ref 135–145)
TOTAL PROTEIN: 6.9 g/dL (ref 6.5–8.1)
Total Bilirubin: 1 mg/dL (ref 0.3–1.2)

## 2016-06-18 LAB — URINALYSIS COMPLETE WITH MICROSCOPIC (ARMC ONLY)
BILIRUBIN URINE: NEGATIVE
Glucose, UA: NEGATIVE mg/dL
Hgb urine dipstick: NEGATIVE
KETONES UR: NEGATIVE mg/dL
LEUKOCYTES UA: NEGATIVE
NITRITE: NEGATIVE
PH: 7 (ref 5.0–8.0)
Protein, ur: NEGATIVE mg/dL
SQUAMOUS EPITHELIAL / LPF: NONE SEEN
Specific Gravity, Urine: 1.002 — ABNORMAL LOW (ref 1.005–1.030)
WBC, UA: NONE SEEN WBC/hpf (ref 0–5)

## 2016-06-18 LAB — CBC
HEMATOCRIT: 36.4 % — AB (ref 40.0–52.0)
HEMOGLOBIN: 12.8 g/dL — AB (ref 13.0–18.0)
MCH: 32 pg (ref 26.0–34.0)
MCHC: 35.1 g/dL (ref 32.0–36.0)
MCV: 91.1 fL (ref 80.0–100.0)
Platelets: 209 10*3/uL (ref 150–440)
RBC: 4 MIL/uL — ABNORMAL LOW (ref 4.40–5.90)
RDW: 14 % (ref 11.5–14.5)
WBC: 10.5 10*3/uL (ref 3.8–10.6)

## 2016-06-18 LAB — ACETAMINOPHEN LEVEL: Acetaminophen (Tylenol), Serum: <10 ug/mL — ABNORMAL LOW (ref 10–30)

## 2016-06-18 LAB — VALPROIC ACID LEVEL: VALPROIC ACID LVL: 12 ug/mL — AB (ref 50.0–100.0)

## 2016-06-18 MED ORDER — RIVAROXABAN 20 MG PO TABS
20.0000 mg | ORAL_TABLET | Freq: Every day | ORAL | Status: DC
  Filled 2016-06-18: qty 1

## 2016-06-18 MED ORDER — RAMIPRIL 10 MG PO CAPS
10.0000 mg | ORAL_CAPSULE | Freq: Every day | ORAL | Status: DC
  Administered 2016-06-18: 10 mg via ORAL
  Filled 2016-06-18: qty 1

## 2016-06-18 MED ORDER — SOTALOL HCL 80 MG PO TABS
80.0000 mg | ORAL_TABLET | Freq: Two times a day (BID) | ORAL | Status: DC
  Administered 2016-06-18: 80 mg via ORAL
  Filled 2016-06-18 (×2): qty 1

## 2016-06-18 MED ORDER — DIVALPROEX SODIUM 125 MG PO DR TAB
125.0000 mg | DELAYED_RELEASE_TABLET | Freq: Two times a day (BID) | ORAL | Status: DC
  Administered 2016-06-18: 125 mg via ORAL
  Filled 2016-06-18 (×2): qty 1

## 2016-06-18 MED ORDER — SIMVASTATIN 40 MG PO TABS
40.0000 mg | ORAL_TABLET | Freq: Every day | ORAL | Status: DC
  Administered 2016-06-18: 40 mg via ORAL
  Filled 2016-06-18: qty 1

## 2016-06-18 MED ORDER — DONEPEZIL HCL 5 MG PO TABS: 10.0000 mg | ORAL_TABLET | Freq: Every day | ORAL | Status: DC

## 2016-06-18 NOTE — ED Notes (Signed)
Patient came out of his room stating he wanted to leave.  Patient became aggitated with staff and was finally able to get patient back into his room and sit on his bed.  Pt states he wants the doctor to come see him and wants to go home. Explained to patient why he was in the ED and that he cannot leave until the psychiatrist comes to see him.  Patient continued to be aggitated towards staff and also verbally abusive.  Patient finally able to calm down after receiving glass of water along with 2 warm blankets.  Patient's bed also moved away from fan that is directly blowing cool air on patient from the ceiling. Pt informed if he wanted more to drink or needed to use the bathroom to ask staff.

## 2016-06-18 NOTE — ED Notes (Signed)
Talked to Son-in-law in waiting room (mobile # [email or web address]).  Anderson reports pt. Gets upset at night time and will try to wake wife.  Pt. Was dx with alzheimer's.  O[place] S[place] also reports pt. Became agitated Wednesday night and left house and was found up the road.  Ouida Sills and family were able to get him calmed down and back to bed Wednesday night.  Pt. Became more  threating tonight.  Anderson states pt. Has not become physical but he does become verbally aggressive.
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
## 2016-06-18 NOTE — ED Notes (Signed)
Offered snack or other beverage besides water but patient refused at this time. Pt is more cooperative and took medications without any problems.  Pt continues to ask about when the Dr. Is going to see him. Explained to patient again that we do not have a set time for when the doctor will see him.
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
## 2016-06-18 NOTE — Discharge Instructions (Signed)

## 2016-06-18 NOTE — ED Notes (Signed)
MD at bedside. (Dr. Clapacs) 
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
## 2016-06-18 NOTE — Progress Notes (Signed)
Pt nurse, Caryl Pina [facility] ER called spoke with nurse confirmed have not contacted wife other than collateral for assessment. Nurse also confirmed that pt. Is able to be d/c. Per Clapacs, pt IVC has been rescinded . and pt does not continue  to meet criteria for inpt.  Bena Kobel K. Nash Shearer, LPC-A, [facility]  Counselor 06/18/2016 3:33 PM

## 2016-06-18 NOTE — ED Notes (Signed)
Patient comes out of his room stating he wants to leave and that he does need to be here. Pt states "this place needs to be shut down"  Pt again verbally abusive towards staff members.  Advised patient to return to his room but pt states that this is not his room.  Pt explained again that he cannot leave the hospital with out the psychiatrist's approval.  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 Pt continues to stand out in the hallway.

## 2016-06-18 NOTE — ED Notes (Signed)
Spoke with son-in-law Quillian Quince about the planned disposition for patient.  Explained to him that the psychiatrist did evaluate the patient and reports the patient does not meet any criteria to be admitted as an inpatient.  Informed that the physician feels as though his behaviors are related to his dementia and should be followed up on with his neurologist Dr. Melrose Nakayama.  Quillian Quince verbalized his understanding of this and will arrange for transportation home in the next hour.  Dr. Jimmye Norman notified of this conversation also and is working on discharge paperwork.

## 2016-06-18 NOTE — ED Notes (Signed)
Pt's son in law given information on elder care services in [facility] along with dc paperwork to follow up with Dr. Melrose Nakayama and Dr. Kary Kos.

## 2016-06-18 NOTE — ED Notes (Signed)
TTS at bedside. 

## 2016-06-18 NOTE — ED Provider Notes (Signed)
Patient's been cleared by psychiatry for discharge. Family is agreeable to take the patient home. He remains medically stable for discharge  Earleen Newport, MD 06/18/16 [phone]
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
## 2016-06-20 ENCOUNTER — Emergency Department: Payer: Medicare Other

## 2016-06-20 ENCOUNTER — Encounter: Payer: Self-pay | Admitting: Emergency Medicine

## 2016-06-20 ENCOUNTER — Emergency Department
Admission: EM | Admit: 2016-06-20 | Discharge: 2016-06-21 | Disposition: A | Discharge status: Other Acute Inpatient Hospital | Payer: Medicare Other | Attending: Emergency Medicine | Admitting: Emergency Medicine

## 2016-06-20 DIAGNOSIS — Z951 Presence of aortocoronary bypass graft: Secondary | ICD-10-CM | POA: Diagnosis not present

## 2016-06-20 DIAGNOSIS — Z8546 Personal history of malignant neoplasm of prostate: Secondary | ICD-10-CM | POA: Diagnosis not present

## 2016-06-20 DIAGNOSIS — F0390 Unspecified dementia without behavioral disturbance: Secondary | ICD-10-CM

## 2016-06-20 DIAGNOSIS — I4891 Unspecified atrial fibrillation: Secondary | ICD-10-CM | POA: Diagnosis not present

## 2016-06-20 DIAGNOSIS — I495 Sick sinus syndrome: Secondary | ICD-10-CM | POA: Diagnosis present

## 2016-06-20 DIAGNOSIS — Z79899 Other long term (current) drug therapy: Secondary | ICD-10-CM | POA: Insufficient documentation

## 2016-06-20 DIAGNOSIS — Z87891 Personal history of nicotine dependence: Secondary | ICD-10-CM | POA: Diagnosis not present

## 2016-06-20 DIAGNOSIS — I2510 Atherosclerotic heart disease of native coronary artery without angina pectoris: Secondary | ICD-10-CM | POA: Insufficient documentation

## 2016-06-20 DIAGNOSIS — G309 Alzheimer's disease, unspecified: Secondary | ICD-10-CM

## 2016-06-20 DIAGNOSIS — R451 Restlessness and agitation: Secondary | ICD-10-CM | POA: Diagnosis present

## 2016-06-20 DIAGNOSIS — F0391 Unspecified dementia with behavioral disturbance: Secondary | ICD-10-CM | POA: Diagnosis not present

## 2016-06-20 DIAGNOSIS — F03918 Unspecified dementia, unspecified severity, with other behavioral disturbance: Secondary | ICD-10-CM

## 2016-06-20 DIAGNOSIS — I10 Essential (primary) hypertension: Secondary | ICD-10-CM | POA: Diagnosis not present

## 2016-06-20 DIAGNOSIS — M1990 Unspecified osteoarthritis, unspecified site: Secondary | ICD-10-CM | POA: Diagnosis not present

## 2016-06-20 DIAGNOSIS — Z008 Encounter for other general examination: Secondary | ICD-10-CM

## 2016-06-20 DIAGNOSIS — E785 Hyperlipidemia, unspecified: Secondary | ICD-10-CM | POA: Diagnosis not present

## 2016-06-20 LAB — URINALYSIS COMPLETE WITH MICROSCOPIC (ARMC ONLY)
Bacteria, UA: NONE SEEN
Bilirubin Urine: NEGATIVE
Glucose, UA: NEGATIVE mg/dL
Hgb urine dipstick: NEGATIVE
LEUKOCYTES UA: NEGATIVE
NITRITE: NEGATIVE
PH: 5 (ref 5.0–8.0)
PROTEIN: NEGATIVE mg/dL
SPECIFIC GRAVITY, URINE: 1.01 (ref 1.005–1.030)
Squamous Epithelial / LPF: NONE SEEN

## 2016-06-20 LAB — SALICYLATE LEVEL: Salicylate Lvl: <4 mg/dL (ref 2.8–30.0)

## 2016-06-20 LAB — CBC WITH DIFFERENTIAL/PLATELET
BASOS ABS: 0.1 10*3/uL (ref 0–0.1)
Basophils Relative: 1 %
EOS ABS: 0.1 10*3/uL (ref 0–0.7)
EOS PCT: 1 %
HCT: 36.5 % — ABNORMAL LOW (ref 40.0–52.0)
Hemoglobin: 13 g/dL (ref 13.0–18.0)
LYMPHS PCT: 11 %
Lymphs Abs: 1 10*3/uL (ref 1.0–3.6)
MCH: 32.1 pg (ref 26.0–34.0)
MCHC: 35.5 g/dL (ref 32.0–36.0)
MCV: 90.3 fL (ref 80.0–100.0)
Monocytes Absolute: 0.6 10*3/uL (ref 0.2–1.0)
Monocytes Relative: 7 %
Neutro Abs: 7 10*3/uL — ABNORMAL HIGH (ref 1.4–6.5)
Neutrophils Relative %: 80 %
PLATELETS: 207 10*3/uL (ref 150–440)
RBC: 4.04 MIL/uL — AB (ref 4.40–5.90)
RDW: 13.9 % (ref 11.5–14.5)
WBC: 8.8 10*3/uL (ref 3.8–10.6)

## 2016-06-20 LAB — COMPREHENSIVE METABOLIC PANEL
ALBUMIN: 4 g/dL (ref 3.5–5.0)
ALT: 18 U/L (ref 17–63)
ANION GAP: 7 (ref 5–15)
AST: 29 U/L (ref 15–41)
Alkaline Phosphatase: 49 U/L (ref 38–126)
BILIRUBIN TOTAL: 0.5 mg/dL (ref 0.3–1.2)
BUN: 26 mg/dL — ABNORMAL HIGH (ref 6–20)
CHLORIDE: 108 mmol/L (ref 101–111)
CO2: 24 mmol/L (ref 22–32)
Calcium: 9.1 mg/dL (ref 8.9–10.3)
Creatinine, Ser: 1.27 mg/dL — ABNORMAL HIGH (ref 0.61–1.24)
GFR calc Af Amer: 57 mL/min — ABNORMAL LOW (ref >60)
GFR calc non Af Amer: 49 mL/min — ABNORMAL LOW (ref >60)
GLUCOSE: 141 mg/dL — AB (ref 65–99)
POTASSIUM: 4 mmol/L (ref 3.5–5.1)
Sodium: 139 mmol/L (ref 135–145)
TOTAL PROTEIN: 7.4 g/dL (ref 6.5–8.1)

## 2016-06-20 LAB — URINE DRUG SCREEN, QUALITATIVE (ARMC ONLY)
Amphetamines, Ur Screen: NOT DETECTED
BENZODIAZEPINE, UR SCRN: NOT DETECTED
Barbiturates, Ur Screen: NOT DETECTED
CANNABINOID 50 NG, UR ~~LOC~~: NOT DETECTED
Cocaine Metabolite,Ur ~~LOC~~: NOT DETECTED
MDMA (ECSTASY) UR SCREEN: NOT DETECTED
Methadone Scn, Ur: NOT DETECTED
Opiate, Ur Screen: NOT DETECTED
PHENCYCLIDINE (PCP) UR S: NOT DETECTED
Tricyclic, Ur Screen: NOT DETECTED

## 2016-06-20 LAB — ACETAMINOPHEN LEVEL

## 2016-06-20 LAB — ETHANOL

## 2016-06-20 MED ORDER — SOTALOL HCL 80 MG PO TABS
80.0000 mg | ORAL_TABLET | Freq: Two times a day (BID) | ORAL | Status: DC
  Administered 2016-06-20 – 2016-06-21 (×2): 80 mg via ORAL
  Filled 2016-06-20 (×3): qty 1

## 2016-06-20 MED ORDER — SIMVASTATIN 40 MG PO TABS
40.0000 mg | ORAL_TABLET | Freq: Every day | ORAL | Status: DC
  Administered 2016-06-21: 40 mg via ORAL
  Filled 2016-06-20: qty 1

## 2016-06-20 MED ORDER — DONEPEZIL HCL 5 MG PO TABS
10.0000 mg | ORAL_TABLET | Freq: Every day | ORAL | Status: DC
  Administered 2016-06-20: 10 mg via ORAL
  Filled 2016-06-20: qty 2

## 2016-06-20 MED ORDER — RISPERIDONE 0.5 MG PO TABS
0.5000 mg | ORAL_TABLET | Freq: Three times a day (TID) | ORAL | Status: DC
  Administered 2016-06-20 – 2016-06-21 (×4): 0.5 mg via ORAL
  Filled 2016-06-20 (×4): qty 1

## 2016-06-20 MED ORDER — SOTALOL HCL 80 MG PO TABS: 80.0000 mg | ORAL_TABLET | Freq: Two times a day (BID) | ORAL | Status: DC

## 2016-06-20 MED ORDER — ZIPRASIDONE MESYLATE 20 MG IM SOLR: 10.0000 mg | Freq: Once | INTRAMUSCULAR | Status: DC

## 2016-06-20 MED ORDER — ZIPRASIDONE MESYLATE 20 MG IM SOLR
10.0000 mg | Freq: Once | INTRAMUSCULAR | Status: AC
  Administered 2016-06-20: 10 mg via INTRAMUSCULAR

## 2016-06-20 MED ORDER — ZIPRASIDONE MESYLATE 20 MG IM SOLR
INTRAMUSCULAR | Status: AC
  Administered 2016-06-20: 10 mg via INTRAMUSCULAR
  Filled 2016-06-20: qty 20

## 2016-06-20 MED ORDER — RAMIPRIL 10 MG PO CAPS
10.0000 mg | ORAL_CAPSULE | Freq: Every day | ORAL | Status: DC
  Administered 2016-06-21: 10 mg via ORAL
  Filled 2016-06-20: qty 1

## 2016-06-20 MED ORDER — RIVAROXABAN 15 MG PO TABS
15.0000 mg | ORAL_TABLET | Freq: Every day | ORAL | Status: DC
  Filled 2016-06-20: qty 1

## 2016-06-20 MED ORDER — TRAZODONE HCL 50 MG PO TABS
50.0000 mg | ORAL_TABLET | Freq: Every day | ORAL | Status: DC
  Administered 2016-06-20: 50 mg via ORAL
  Filled 2016-06-20: qty 1

## 2016-06-20 MED ORDER — DIVALPROEX SODIUM 125 MG PO DR TAB: 125.0000 mg | DELAYED_RELEASE_TABLET | Freq: Two times a day (BID) | ORAL | Status: DC

## 2016-06-20 NOTE — Progress Notes (Signed)
  LCSW reviewed ED psychiatrist note and consulted with TTS and Dr Jerilee Hoh. Patient will be referred to Geri-Pychiatric facility. LCSW will contact family and speak to his wife and provide her with resources for future dementia care facilities.  [employer] LCSW [phone]
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
## 2016-06-20 NOTE — ED Notes (Signed)
Pt asking for his bed to be made softer. Was in the recliner for a short time but he wants to sleep. Extra blankets placed over mattress, pt tucked into bed and the lights turned down.

## 2016-06-20 NOTE — ED Notes (Signed)
Pt becoming agitated wanting his clothes back and yelling. Pt escorted back to his room. Upset but still cooperating
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
## 2016-06-20 NOTE — BH Assessment (Signed)
Referral information for Geriatric Placement have been faxed to;    [facility]ana [facility] [phone]),    [facility] Cella [phone] or [phone]),    [facility] [phone]),    [facility] [phone])   [place] [phone] or [phone]),    [facility] [phone])   [facility] [phone])   [facility] [phone]).
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
## 2016-06-21 DIAGNOSIS — F0391 Unspecified dementia with behavioral disturbance: Secondary | ICD-10-CM | POA: Diagnosis not present

## 2016-06-21 MED ORDER — LOPERAMIDE HCL 2 MG PO CAPS
4.0000 mg | ORAL_CAPSULE | Freq: Once | ORAL | Status: DC
  Filled 2016-06-21: qty 2
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
## 2016-06-21 NOTE — ED Notes (Signed)
BEHAVIORAL HEALTH ROUNDING  Patient sleeping: No.  Patient alert and oriented: yes to his baseline Behavior appropriate: Yes. ; If no, describe:  Nutrition and fluids offered: Yes  Toileting and hygiene offered: Yes  Sitter present: not applicable, Q 15 min safety rounds and observation.  Law enforcement present: Yes ODS  

## 2016-06-21 NOTE — ED Provider Notes (Signed)
Patient has been accepted in transfer to [place], MD 06/21/16 1134

## 2016-06-21 NOTE — ED Notes (Signed)
BEHAVIORAL HEALTH ROUNDING Patient sleeping: No. Patient alert and oriented: yes- oriented to self Behavior appropriate: Yes ; If no, describe:  Nutrition and fluids offered: Yes  Toileting and hygiene offered: Yes  Sitter present: q 15 minute checks Law enforcement present: Yes

## 2016-06-21 NOTE — Progress Notes (Signed)
Patient has been accepted to [facility].  Patient assigned to room 9451 bed 1 Accepting physician is Dr. Sanjuana Letters.  Call report to [phone].  Representative was [employer].  ER Staff is aware of it [facility] Dr. Jimmye Norman, [place] Patient's Nurse)    Allena Napoleon. Nash Shearer, LPC-A, [facility]  Counselor 06/21/2016 11:18 AM

## 2016-06-21 NOTE — ED Notes (Signed)
BEHAVIORAL HEALTH ROUNDING Patient sleeping: Yes.   Patient alert and oriented: not applicable SLEEPING Behavior appropriate: Yes.  ; If no, describe: SLEEPING Nutrition and fluids offered: No SLEEPING Toileting and hygiene offered: NoSLEEPING Sitter present: not applicable, Q 15 min safety rounds and observation. Law enforcement present: Yes ODS 

## 2016-06-21 NOTE — ED Notes (Signed)
Patient transferred from stretcher to recliner and given water to drink at this time.

## 2016-06-21 NOTE — ED Notes (Signed)
BEHAVIORAL HEALTH ROUNDING Patient sleeping: No. Patient alert and oriented: oriented to self Behavior appropriate: Yes.  ; If no, describe:  Nutrition and fluids offered: Yes  Toileting and hygiene offered: Yes  Sitter present: q 15 minute checks Law enforcement present: Yes

## 2016-06-21 NOTE — ED Notes (Signed)
Patient stretcher exchanged for a hospital bed.

## 2016-06-21 NOTE — ED Notes (Signed)
BEHAVIORAL HEALTH ROUNDING  Patient sleeping: No.  Patient alert and oriented: yes to his baseline of dementia Behavior appropriate: Yes. ; If no, describe:  Nutrition and fluids offered: Yes  Toileting and hygiene offered: Yes  Sitter present: not applicable, Q 15 min safety rounds and observation.  Law enforcement present: Yes ODS

## 2016-06-21 NOTE — ED Notes (Signed)
Pt refused meal tray. Pt stated that he "does not like [place] and is not hungry."

## 2016-06-21 NOTE — ED Provider Notes (Signed)
-----------------------------------------   3:40 AM on 06/21/2016 -----------------------------------------   Blood pressure 165/85, pulse 85, temperature 97.8 F (36.6 C), temperature source Oral, resp. rate 18, SpO2 100 %.  The patient had no acute events since last update.  Calm and cooperative at this time.  Disposition is pending per Psychiatry/Behavioral Medicine team recommendations.     Nena Polio, MD 06/21/16 [phone]

## 2016-06-21 NOTE — ED Notes (Signed)
Called for transport [id number]

## 2016-06-21 NOTE — ED Notes (Signed)
Patient just had a second episode of diarrhea. MD notified.

## 2016-06-21 NOTE — Progress Notes (Signed)
Contacted Luanne, Secretary regarding pt. Acceptance to [place]. Ellie Bryand K. Nash Shearer, LPC-A, [facility]  Counselor 06/21/2016 11:25 AM

## 2016-06-21 NOTE — ED Notes (Signed)
Pt able to ambulate independently at time of discharge. Pts belongings given to police. Pt in NAD at time of discharge.

## 2016-08-11 ENCOUNTER — Emergency Department
Admission: EM | Admit: 2016-08-11 | Discharge: 2016-08-11 | Disposition: A | Discharge status: Home / Self Care | Payer: Medicare Other | Attending: Emergency Medicine | Admitting: Emergency Medicine

## 2016-08-11 ENCOUNTER — Encounter: Payer: Self-pay | Admitting: *Deleted

## 2016-08-11 DIAGNOSIS — Z87891 Personal history of nicotine dependence: Secondary | ICD-10-CM | POA: Diagnosis not present

## 2016-08-11 DIAGNOSIS — G309 Alzheimer's disease, unspecified: Secondary | ICD-10-CM | POA: Insufficient documentation

## 2016-08-11 DIAGNOSIS — Z8546 Personal history of malignant neoplasm of prostate: Secondary | ICD-10-CM | POA: Diagnosis not present

## 2016-08-11 DIAGNOSIS — F0280 Dementia in other diseases classified elsewhere without behavioral disturbance: Secondary | ICD-10-CM

## 2016-08-11 DIAGNOSIS — R4182 Altered mental status, unspecified: Secondary | ICD-10-CM | POA: Diagnosis present

## 2016-08-11 DIAGNOSIS — I10 Essential (primary) hypertension: Secondary | ICD-10-CM | POA: Diagnosis not present

## 2016-08-11 DIAGNOSIS — I2510 Atherosclerotic heart disease of native coronary artery without angina pectoris: Secondary | ICD-10-CM | POA: Insufficient documentation

## 2016-08-11 LAB — COMPREHENSIVE METABOLIC PANEL
ALBUMIN: 3.5 g/dL (ref 3.5–5.0)
ALK PHOS: 46 U/L (ref 38–126)
ALT: 21 U/L (ref 17–63)
ANION GAP: 11 (ref 5–15)
AST: 27 U/L (ref 15–41)
BUN: 24 mg/dL — ABNORMAL HIGH (ref 6–20)
CALCIUM: 8.9 mg/dL (ref 8.9–10.3)
CO2: 23 mmol/L (ref 22–32)
Chloride: 102 mmol/L (ref 101–111)
Creatinine, Ser: 0.89 mg/dL (ref 0.61–1.24)
GFR calc Af Amer: >60 mL/min (ref >60)
GFR calc non Af Amer: >60 mL/min (ref >60)
GLUCOSE: 128 mg/dL — AB (ref 65–99)
Potassium: 3.7 mmol/L (ref 3.5–5.1)
SODIUM: 136 mmol/L (ref 135–145)
Total Bilirubin: 0.9 mg/dL (ref 0.3–1.2)
Total Protein: 7 g/dL (ref 6.5–8.1)

## 2016-08-11 LAB — URINALYSIS COMPLETE WITH MICROSCOPIC (ARMC ONLY)
BACTERIA UA: NONE SEEN
Bilirubin Urine: NEGATIVE
GLUCOSE, UA: NEGATIVE mg/dL
Hgb urine dipstick: NEGATIVE
Leukocytes, UA: NEGATIVE
Nitrite: NEGATIVE
Protein, ur: 100 mg/dL — AB
SPECIFIC GRAVITY, URINE: 1.026 (ref 1.005–1.030)
pH: 5 (ref 5.0–8.0)

## 2016-08-11 LAB — CBC
HCT: 39.9 % — ABNORMAL LOW (ref 40.0–52.0)
HEMOGLOBIN: 13.6 g/dL (ref 13.0–18.0)
MCH: 31 pg (ref 26.0–34.0)
MCHC: 34 g/dL (ref 32.0–36.0)
MCV: 91.2 fL (ref 80.0–100.0)
Platelets: 265 10*3/uL (ref 150–440)
RBC: 4.37 MIL/uL — ABNORMAL LOW (ref 4.40–5.90)
RDW: 14.3 % (ref 11.5–14.5)
WBC: 11.8 10*3/uL — ABNORMAL HIGH (ref 3.8–10.6)

## 2016-08-11 MED ORDER — SODIUM CHLORIDE 0.9 % IV BOLUS (SEPSIS)
500.0000 mL | INTRAVENOUS | Status: AC
  Administered 2016-08-11: 500 mL via INTRAVENOUS

## 2016-08-11 NOTE — ED Provider Notes (Signed)
[facility] Emergency Department Provider Note  ____________________________________________   First MD Initiated Contact with Patient 08/11/16 1912     (approximate)  I have reviewed the triage vital signs and the nursing notes.   HISTORY  Chief Complaint Altered Mental Status  L5 caveat: The patient has Alzheimer's dementia and is not able to provide a reliable history or review of systems  HPI Steve Aguirre is a 82 y.o. male who presents by EMS from his living facility for evaluation of altered mental status.Details are unclear, apparently the staff just felt he "not acting normal".  The patient denies any problems and it is irritated that he was sent.  He said "I do not know why they sent me here, I think someone made a mistake.  I do not need to be here."  He denies headache, neck pain, chest pain, shortness of breath, abdominal pain, nausea, vomiting, dysuria.  I am not able to quantify the severity of his symptoms because he seems to be alert and appropriate and answering simple questions for me with no complaints.   Past Medical History:  Diagnosis Date  . Arthritis   . Atrial fibrillation ([place])   . Cancer [facility])    prostate  . Coronary artery disease   . Hyperlipemia   . Hypertension   . Scarlet fever     Patient Active Problem List   Diagnosis Date Noted  . Atrial fibrillation ([place]) 06/20/2016  . HTN (hypertension) 06/20/2016  . Dyslipidemia 06/20/2016  . Alzheimer's dementia without behavioral disturbance 06/20/2016  . Sick sinus syndrome ([place]) 01/26/2016    Past Surgical History:  Procedure Laterality Date  . CARDIAC CATHETERIZATION    . CATARACT EXTRACTION W/ INTRAOCULAR LENS  IMPLANT, BILATERAL Bilateral   . CORONARY ARTERY BYPASS GRAFT    . JOINT REPLACEMENT     eft knee  . NASAL SINUS SURGERY    . PACEMAKER INSERTION N/A 01/26/2016   Procedure: INSERTION PACEMAKER;  Surgeon: Isaias Cowman, MD;  Location: [facility] ORS;   Service: Cardiovascular;  Laterality: N/A;  . PROSTATE SURGERY      Prior to Admission medications   Medication Sig Start Date End Date Taking? Authorizing Provider  divalproex (DEPAKOTE) 125 MG DR tablet Take 125 mg by mouth 2 (two) times daily.    Historical Provider, MD  donepezil (ARICEPT) 10 MG tablet Take 10 mg by mouth at bedtime.    Historical Provider, MD  ramipril (ALTACE) 10 MG capsule Take 10 mg by mouth daily.    Historical Provider, MD  rivaroxaban (XARELTO) 20 MG TABS tablet Take 20 mg by mouth daily with supper.    Historical Provider, MD  simvastatin (ZOCOR) 40 MG tablet Take 40 mg by mouth daily.    Historical Provider, MD  sotalol (BETAPACE) 80 MG tablet Take 80 mg by mouth 2 (two) times daily.    Historical Provider, MD    Allergies Review of patient's allergies indicates no known allergies.  History reviewed. No pertinent family history.  Social History Social History  Substance Use Topics  . Smoking status: Former Research scientist (life sciences)  . Smokeless tobacco: Not on file  . Alcohol use No     Comment: occ.    Review of Systems Constitutional: No fever/chills Eyes: No visual changes. ENT: No sore throat. Cardiovascular: Denies chest pain. Respiratory: Denies shortness of breath. Gastrointestinal: No abdominal pain.  No nausea, no vomiting.  No diarrhea.  No constipation. Genitourinary: Negative for dysuria. Musculoskeletal: Negative for back pain.  Skin: Negative for rash. Neurological: Negative for headaches, focal weakness or numbness.  10-point ROS otherwise negative.  ____________________________________________   PHYSICAL EXAM:  VITAL SIGNS: ED Triage Vitals [08/11/16 1811]  Enc Vitals Group     BP (!) 148/99     Pulse Rate 80     Resp 19     Temp 97.7 F (36.5 C)     Temp Source Oral     SpO2 97 %     Weight 144 lb (65.3 kg)     Height 5\' 9"  (1.753 m)     Head Circumference      Peak Flow      Pain Score      Pain Loc      Pain Edu?      Excl.  in [place]?     Constitutional: Alert and oriented To self.  In terms of location and he knew that he was at a medical facility but could not identify what kind. Eyes: Conjunctivae are normal. PERRL. EOMI. Head: Atraumatic. Nose: No congestion/rhinnorhea. Mouth/Throat: Mucous membranes are moist.  Oropharynx non-erythematous. Neck: No stridor.  No meningeal signs.  No cervical spine tenderness to palpation. Cardiovascular: Normal rate, regular rhythm. Good peripheral circulation. Grossly normal heart sounds. Respiratory: Normal respiratory effort.  No retractions. Lungs CTAB. Gastrointestinal: Soft and nontender. No distention.  Musculoskeletal: No lower extremity tenderness nor edema. No gross deformities of extremities. Neurologic:  Normal speech and language. No gross focal neurologic deficits are appreciated.  Skin:  Skin is warm, dry and intact. No rash noted. Psychiatric: Mood and affect are normal. Speech and behavior are normal.  ____________________________________________   LABS (all labs ordered are listed, but only abnormal results are displayed)  Labs Reviewed  COMPREHENSIVE METABOLIC PANEL - Abnormal; Notable for the following:       Result Value   Glucose, Bld 128 (*)    BUN 24 (*)    All other components within normal limits  CBC - Abnormal; Notable for the following:    WBC 11.8 (*)    RBC 4.37 (*)    HCT 39.9 (*)    All other components within normal limits  URINALYSIS COMPLETEWITH MICROSCOPIC ([facility] ONLY) - Abnormal; Notable for the following:    Color, Urine AMBER (*)    APPearance CLEAR (*)    Ketones, ur 2+ (*)    Protein, ur 100 (*)    Squamous Epithelial / LPF 0-5 (*)    All other components within normal limits  CBG MONITORING, ED   ____________________________________________  EKG  ED ECG REPORT I, Ethen Bannan, the attending physician, personally viewed and interpreted this ECG.  Date: 08/11/2016 EKG Time: 18:07 Rate: 65 Rhythm: Atrial  fibrillation/flutter with a ventricular paced rhythm QRS Axis: Left Axis deviation Intervals: Paced rhythm ST/T Wave abnormalities: Non-specific ST segment / T-wave changes, but no evidence of acute ischemia. Conduction Disturbances: none Narrative Interpretation: unremarkable  ____________________________________________  RADIOLOGY   No results found.  ____________________________________________   PROCEDURES  Procedure(s) performed:   Procedures   Critical Care performed: No ____________________________________________   INITIAL IMPRESSION / ASSESSMENT AND PLAN / ED COURSE  Pertinent labs & imaging results that were available during my care of the patient were reviewed by me and considered in my medical decision making (see chart for details).  The patient has dementia but his workup was completely reassuring today.  He had a few ketones in his urine but no other signs of volume depletion/hydration and has normal kidney  function.  Will give him a small fluid bolus of 500 mL prior to discharge.  He is able to tolerate by mouth intake in the emergency department.  We will discharge him back to his facility as there is no indication of emergent or acute medical condition at this time.  There is no indication for any imaging as he has no other symptoms for which they would be indicated and no history or evidence of trauma.    ____________________________________________  FINAL CLINICAL IMPRESSION(S) / ED DIAGNOSES  Final diagnoses:  Alzheimer's dementia     MEDICATIONS GIVEN DURING THIS VISIT:  Medications  sodium chloride 0.9 % bolus 500 mL (not administered)     NEW OUTPATIENT MEDICATIONS STARTED DURING THIS VISIT:  New Prescriptions   No medications on file      Note:  This document was prepared using Dragon voice recognition software and may include unintentional dictation errors.    Hinda Kehr, MD 08/11/16 1945

## 2016-08-11 NOTE — ED Notes (Signed)
Report called to facility

## 2016-08-11 NOTE — Discharge Instructions (Signed)
As we discussed, your workup today was reassuring.  Though we do not know exactly what is causing your symptoms, it appears that you have no emergent medical condition at this time are safe to go home and follow up as recommended in this paperwork.  We recommend no changes to your medications.  It is important that you drink plenty of fluids as it looks like you may be slightly volume depleted, but there is no evidence of any kidney problems or dehydration at this time.  We gave a small bolus of IV fluids while you were in the Emergency Department.  Please return immediately to the Emergency Department if you develop any new or worsening symptoms that concern you.

## 2016-08-11 NOTE — ED Notes (Signed)
EMS reports staff at [facility] reported believing that pt had UTI, unknown as to why staff thought this.

## 2016-08-11 NOTE — ED Triage Notes (Signed)
Pt arrived to ED from [facility] after staff reports pt has not been acting normal. Pt has had decreased activity today and decreased ambulation from normal. Pt denies pain or symptoms as reported by EMS. Pt reports, "I don't known why they sent me here." Pt will not answer RN when asking questions so orientation is unknown. Pt did verbalize, "I will not answer you, I don't want to be here." Pt in NAD and vitals WNL.

## 2016-08-25 ENCOUNTER — Emergency Department: Payer: Medicare Other

## 2016-08-25 ENCOUNTER — Inpatient Hospital Stay
Admission: EM | Admit: 2016-08-25 | Discharge: 2016-08-30 | DRG: 481 | Disposition: A | Discharge status: Skilled Nursing Facility | Payer: Medicare Other | Attending: Internal Medicine | Admitting: Internal Medicine

## 2016-08-25 ENCOUNTER — Encounter: Payer: Self-pay | Admitting: Emergency Medicine

## 2016-08-25 DIAGNOSIS — E785 Hyperlipidemia, unspecified: Secondary | ICD-10-CM | POA: Diagnosis present

## 2016-08-25 DIAGNOSIS — F39 Unspecified mood [affective] disorder: Secondary | ICD-10-CM | POA: Diagnosis present

## 2016-08-25 DIAGNOSIS — Z79899 Other long term (current) drug therapy: Secondary | ICD-10-CM

## 2016-08-25 DIAGNOSIS — S72141A Displaced intertrochanteric fracture of right femur, initial encounter for closed fracture: Secondary | ICD-10-CM | POA: Diagnosis not present

## 2016-08-25 DIAGNOSIS — Z7901 Long term (current) use of anticoagulants: Secondary | ICD-10-CM

## 2016-08-25 DIAGNOSIS — R410 Disorientation, unspecified: Secondary | ICD-10-CM | POA: Diagnosis present

## 2016-08-25 DIAGNOSIS — Z961 Presence of intraocular lens: Secondary | ICD-10-CM | POA: Diagnosis present

## 2016-08-25 DIAGNOSIS — Z96652 Presence of left artificial knee joint: Secondary | ICD-10-CM | POA: Diagnosis present

## 2016-08-25 DIAGNOSIS — Z951 Presence of aortocoronary bypass graft: Secondary | ICD-10-CM

## 2016-08-25 DIAGNOSIS — E039 Hypothyroidism, unspecified: Secondary | ICD-10-CM | POA: Diagnosis present

## 2016-08-25 DIAGNOSIS — Z66 Do not resuscitate: Secondary | ICD-10-CM | POA: Diagnosis present

## 2016-08-25 DIAGNOSIS — W1830XA Fall on same level, unspecified, initial encounter: Secondary | ICD-10-CM | POA: Diagnosis present

## 2016-08-25 DIAGNOSIS — Z8546 Personal history of malignant neoplasm of prostate: Secondary | ICD-10-CM

## 2016-08-25 DIAGNOSIS — Z9842 Cataract extraction status, left eye: Secondary | ICD-10-CM

## 2016-08-25 DIAGNOSIS — S72001A Fracture of unspecified part of neck of right femur, initial encounter for closed fracture: Secondary | ICD-10-CM | POA: Diagnosis present

## 2016-08-25 DIAGNOSIS — D62 Acute posthemorrhagic anemia: Secondary | ICD-10-CM | POA: Diagnosis not present

## 2016-08-25 DIAGNOSIS — Z950 Presence of cardiac pacemaker: Secondary | ICD-10-CM

## 2016-08-25 DIAGNOSIS — I482 Chronic atrial fibrillation, unspecified: Secondary | ICD-10-CM

## 2016-08-25 DIAGNOSIS — I2510 Atherosclerotic heart disease of native coronary artery without angina pectoris: Secondary | ICD-10-CM | POA: Diagnosis present

## 2016-08-25 DIAGNOSIS — Z9841 Cataract extraction status, right eye: Secondary | ICD-10-CM

## 2016-08-25 DIAGNOSIS — F0280 Dementia in other diseases classified elsewhere without behavioral disturbance: Secondary | ICD-10-CM | POA: Diagnosis present

## 2016-08-25 DIAGNOSIS — I10 Essential (primary) hypertension: Secondary | ICD-10-CM | POA: Diagnosis present

## 2016-08-25 DIAGNOSIS — Z419 Encounter for procedure for purposes other than remedying health state, unspecified: Secondary | ICD-10-CM

## 2016-08-25 DIAGNOSIS — E876 Hypokalemia: Secondary | ICD-10-CM | POA: Diagnosis present

## 2016-08-25 DIAGNOSIS — M25551 Pain in right hip: Secondary | ICD-10-CM | POA: Diagnosis not present

## 2016-08-25 DIAGNOSIS — M1990 Unspecified osteoarthritis, unspecified site: Secondary | ICD-10-CM | POA: Diagnosis present

## 2016-08-25 DIAGNOSIS — G309 Alzheimer's disease, unspecified: Secondary | ICD-10-CM | POA: Diagnosis present

## 2016-08-25 DIAGNOSIS — W19XXXA Unspecified fall, initial encounter: Secondary | ICD-10-CM

## 2016-08-25 DIAGNOSIS — R1310 Dysphagia, unspecified: Secondary | ICD-10-CM | POA: Diagnosis present

## 2016-08-25 NOTE — ED Triage Notes (Signed)
Pt to rm 17 via EMS from [facility] s/p unwitnessed fall.  PT c/o pain to right hip, shortening and rotation noted.  Pt hx alzheimer's disease.  Pt NAD at this time

## 2016-08-25 NOTE — ED Provider Notes (Signed)
[facility] Emergency Department Provider Note   ____________________________________________   First MD Initiated Contact with Patient 08/25/16 2312     (approximate)  I have reviewed the triage vital signs and the nursing notes.   HISTORY  Chief Complaint Fall and Hip Pain (right)  Limited by dementia  HPI Steve Aguirre is a 82 y.o. male brought to the ED from [facility] status post unwitnessed fall with a chief complaint of right hip pain. Patient has a history of Alzheimer's dementia who was found after an unwitnessed fall. Complains of pain to his right hip. Also has a history of atrial fibrillation on Xarelto, sick sinus syndrome status post pacer. Rest of history is limited by patient's dementia, the patient denies headache, neck pain, chest pain, shortness of breath, abdominal pain, nausea, vomiting, diarrhea.Nothing makes his symptoms better. Movement makes the symptoms worse.   Past Medical History:  Diagnosis Date  . Arthritis   . Atrial fibrillation ([place])   . Cancer [facility])    prostate  . Coronary artery disease   . Hyperlipemia   . Hypertension   . Scarlet fever     Patient Active Problem List   Diagnosis Date Noted  . Atrial fibrillation ([place]) 06/20/2016  . HTN (hypertension) 06/20/2016  . Dyslipidemia 06/20/2016  . Alzheimer's dementia without behavioral disturbance 06/20/2016  . Sick sinus syndrome ([place]) 01/26/2016    Past Surgical History:  Procedure Laterality Date  . CARDIAC CATHETERIZATION    . CATARACT EXTRACTION W/ INTRAOCULAR LENS  IMPLANT, BILATERAL Bilateral   . CORONARY ARTERY BYPASS GRAFT    . JOINT REPLACEMENT     eft knee  . NASAL SINUS SURGERY    . PACEMAKER INSERTION N/A 01/26/2016   Procedure: INSERTION PACEMAKER;  Surgeon: Isaias Cowman, MD;  Location: [facility] ORS;  Service: Cardiovascular;  Laterality: N/A;  . PROSTATE SURGERY      Prior to Admission medications   Medication Sig Start Date End  Date Taking? Authorizing Provider  acetaminophen (TYLENOL) 500 MG tablet Take 500 mg by mouth every 6 (six) hours as needed.   Yes Historical Provider, MD  ARIPiprazole (ABILIFY) 2 MG tablet Take 2 mg by mouth daily.   Yes Historical Provider, MD  divalproex (DEPAKOTE) 125 MG DR tablet Take 250 mg by mouth 2 (two) times daily.    Yes Historical Provider, MD  donepezil (ARICEPT) 10 MG tablet Take 10 mg by mouth at bedtime.   Yes Historical Provider, MD  levothyroxine (SYNTHROID, LEVOTHROID) 50 MCG tablet Take 50 mcg by mouth daily before breakfast.   Yes Historical Provider, MD  Melatonin-Pyridoxine (MELATIN PO) Take 3 mg by mouth at bedtime.   Yes Historical Provider, MD  Multiple Vitamin (THEREMS PO) Take 1 tablet by mouth daily.   Yes Historical Provider, MD  ramipril (ALTACE) 10 MG capsule Take 10 mg by mouth daily.   Yes Historical Provider, MD  Rivaroxaban (XARELTO) 15 MG TABS tablet Take 15 mg by mouth daily with supper.    Yes Historical Provider, MD  sertraline (ZOLOFT) 100 MG tablet Take 100 mg by mouth at bedtime.   Yes Historical Provider, MD  simvastatin (ZOCOR) 40 MG tablet Take 40 mg by mouth daily at 6 PM.   Yes Historical Provider, MD  sotalol (BETAPACE) 80 MG tablet Take 80 mg by mouth 2 (two) times daily.   Yes Historical Provider, MD  vitamin B-12 (CYANOCOBALAMIN) 1000 MCG tablet Take 1,000 mcg by mouth daily.   Yes Historical Provider,  MD    Allergies Review of patient's allergies indicates no known allergies.  History reviewed. No pertinent family history.  Social History Social History  Substance Use Topics  . Smoking status: Former Research scientist (life sciences)  . Smokeless tobacco: Never Used  . Alcohol use No     Comment: occ.    Review of Systems  Constitutional: No fever/chills. Eyes: No visual changes. ENT: No sore throat. Cardiovascular: Denies chest pain. Respiratory: Denies shortness of breath. Gastrointestinal: No abdominal pain.  No nausea, no vomiting.  No diarrhea.   No constipation. Genitourinary: Negative for dysuria. Musculoskeletal: Positive for right hip pain. Negative for back pain. Skin: Negative for rash. Neurological: Negative for headaches, focal weakness or numbness.  10-point ROS otherwise negative.  ____________________________________________   PHYSICAL EXAM:  VITAL SIGNS: ED Triage Vitals [08/25/16 2310]  Enc Vitals Group     BP      Pulse      Resp      Temp      Temp src      SpO2      Weight 150 lb (68 kg)     Height 5\' 10"  (1.778 m)     Head Circumference      Peak Flow      Pain Score      Pain Loc      Pain Edu?      Excl. in [place]?     Constitutional: Alert and oriented. Frail appearing and in mild acute distress. Eyes: Conjunctivae are normal. PERRL. EOMI. Head: Atraumatic. Nose: No congestion/rhinnorhea. Mouth/Throat: Mucous membranes are moist.  Oropharynx non-erythematous. Neck: No stridor.  No cervical spine tenderness to palpation.  No step-offs or deformities noted. Cardiovascular: Normal rate, irregular rhythm. Grossly normal heart sounds.  Good peripheral circulation. Respiratory: Normal respiratory effort.  No retractions. Lungs CTAB. Gastrointestinal: Soft and nontender. No distention. No abdominal bruits. No CVA tenderness. Musculoskeletal: Right hip is shortened and rotated. Hip is tender to palpation with limited range of motion. 2+ femoral and distal pulses. Neurologic:  Alert and oriented to person only. Normal speech and language. No gross focal neurologic deficits are appreciated.  Skin:  Skin is warm, dry and intact. No rash noted. Psychiatric: Mood and affect are normal. Speech and behavior are normal.  ____________________________________________   LABS (all labs ordered are listed, but only abnormal results are displayed)  Labs Reviewed  PROTIME-INR - Abnormal; Notable for the following:       Result Value   Prothrombin Time 21.0 (*)    All other components within normal limits    BASIC METABOLIC PANEL - Abnormal; Notable for the following:    Potassium 2.8 (*)    Chloride 99 (*)    Glucose, Bld 112 (*)    Calcium 8.6 (*)    All other components within normal limits  TROPONIN I  CBC WITH DIFFERENTIAL/PLATELET  TYPE AND SCREEN   ____________________________________________  EKG  ED ECG REPORT I, Ezrie Bunyan J, the attending physician, personally viewed and interpreted this ECG.   Date: 08/25/2016  EKG Time: 2316  Rate: 71  Rhythm: atrial fibrillation, rate 71  Axis: LAD  Intervals:none  ST&T Change: Nonspecific  ____________________________________________  RADIOLOGY  CT head interpreted per Dr. Collins Scotland: 1. No acute intracranial abnormality.  2. Moderate atrophy and findings of chronic microvascular disease.  3. 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  Right hip x-ray (viewed by me, interpreted per Dr. Quintella Reichert): Mildly displaced intertrochanteric fracture of the right femoral  neck.     Chest 1 view (viewed by me, interpreted per Dr. Radene Knee): No acute cardiopulmonary process seen. No displaced rib fractures  identified.    ____________________________________________   PROCEDURES  Procedure(s) performed: None  Procedures  Critical Care performed: No  ____________________________________________   INITIAL IMPRESSION / ASSESSMENT AND PLAN / ED COURSE  Pertinent labs & imaging results that were available during my care of the patient were reviewed by me and considered in my medical decision making (see chart for details).  82 year old male who presents status post a witnessed fall with right hip pain. Leg is shortened and rotated; clinical suspicion for right hip fracture. As patient is on Xarelto, is demented and unable to provide clear history, will obtain CT head to evaluate for  intracranial hemorrhage.  Clinical Course  Comment By Time  Updated patient and family who is at bedside. Discussed with Dr. Rudene Christians from orthopedics and Dr. Jannifer Franklin from the hospitalist service who will admit patient to the hospital. Paulette Blanch, MD 09/07 0043     ____________________________________________   FINAL CLINICAL IMPRESSION(S) / ED DIAGNOSES  Final diagnoses:  Fall, initial encounter  Right hip pain  Hip fracture, right, closed, initial encounter ([place])  Chronic atrial fibrillation ([place])  Alzheimer's dementia  Hypokalemia      NEW MEDICATIONS STARTED DURING THIS VISIT:  New Prescriptions   No medications on file     Note:  This document was prepared using Dragon voice recognition software and may include unintentional dictation errors.    Paulette Blanch, MD 08/26/16 [phone]

## 2016-08-25 NOTE — ED Notes (Signed)
Patient transported to X-ray and CT 

## 2016-08-26 DIAGNOSIS — Z66 Do not resuscitate: Secondary | ICD-10-CM | POA: Diagnosis present

## 2016-08-26 DIAGNOSIS — F0280 Dementia in other diseases classified elsewhere without behavioral disturbance: Secondary | ICD-10-CM | POA: Diagnosis present

## 2016-08-26 DIAGNOSIS — Z8546 Personal history of malignant neoplasm of prostate: Secondary | ICD-10-CM | POA: Diagnosis not present

## 2016-08-26 DIAGNOSIS — Z951 Presence of aortocoronary bypass graft: Secondary | ICD-10-CM | POA: Diagnosis not present

## 2016-08-26 DIAGNOSIS — S72141A Displaced intertrochanteric fracture of right femur, initial encounter for closed fracture: Secondary | ICD-10-CM | POA: Diagnosis present

## 2016-08-26 DIAGNOSIS — I10 Essential (primary) hypertension: Secondary | ICD-10-CM | POA: Diagnosis present

## 2016-08-26 DIAGNOSIS — E876 Hypokalemia: Secondary | ICD-10-CM | POA: Diagnosis present

## 2016-08-26 DIAGNOSIS — M25551 Pain in right hip: Secondary | ICD-10-CM | POA: Diagnosis present

## 2016-08-26 DIAGNOSIS — W1830XA Fall on same level, unspecified, initial encounter: Secondary | ICD-10-CM | POA: Diagnosis present

## 2016-08-26 DIAGNOSIS — F39 Unspecified mood [affective] disorder: Secondary | ICD-10-CM | POA: Diagnosis present

## 2016-08-26 DIAGNOSIS — S72001A Fracture of unspecified part of neck of right femur, initial encounter for closed fracture: Secondary | ICD-10-CM | POA: Diagnosis present

## 2016-08-26 DIAGNOSIS — D62 Acute posthemorrhagic anemia: Secondary | ICD-10-CM | POA: Diagnosis not present

## 2016-08-26 DIAGNOSIS — Z7901 Long term (current) use of anticoagulants: Secondary | ICD-10-CM | POA: Diagnosis not present

## 2016-08-26 DIAGNOSIS — I482 Chronic atrial fibrillation: Secondary | ICD-10-CM | POA: Diagnosis present

## 2016-08-26 DIAGNOSIS — R1310 Dysphagia, unspecified: Secondary | ICD-10-CM | POA: Diagnosis present

## 2016-08-26 DIAGNOSIS — R410 Disorientation, unspecified: Secondary | ICD-10-CM | POA: Diagnosis present

## 2016-08-26 DIAGNOSIS — Z96652 Presence of left artificial knee joint: Secondary | ICD-10-CM | POA: Diagnosis present

## 2016-08-26 DIAGNOSIS — Z950 Presence of cardiac pacemaker: Secondary | ICD-10-CM | POA: Diagnosis not present

## 2016-08-26 DIAGNOSIS — E039 Hypothyroidism, unspecified: Secondary | ICD-10-CM | POA: Diagnosis present

## 2016-08-26 DIAGNOSIS — E785 Hyperlipidemia, unspecified: Secondary | ICD-10-CM | POA: Diagnosis present

## 2016-08-26 DIAGNOSIS — M1990 Unspecified osteoarthritis, unspecified site: Secondary | ICD-10-CM | POA: Diagnosis present

## 2016-08-26 DIAGNOSIS — Z9841 Cataract extraction status, right eye: Secondary | ICD-10-CM | POA: Diagnosis not present

## 2016-08-26 DIAGNOSIS — Z9842 Cataract extraction status, left eye: Secondary | ICD-10-CM | POA: Diagnosis not present

## 2016-08-26 DIAGNOSIS — Z961 Presence of intraocular lens: Secondary | ICD-10-CM | POA: Diagnosis present

## 2016-08-26 DIAGNOSIS — G309 Alzheimer's disease, unspecified: Secondary | ICD-10-CM | POA: Diagnosis present

## 2016-08-26 DIAGNOSIS — I2510 Atherosclerotic heart disease of native coronary artery without angina pectoris: Secondary | ICD-10-CM | POA: Diagnosis present

## 2016-08-26 LAB — BASIC METABOLIC PANEL
ANION GAP: 11 (ref 5–15)
BUN: 14 mg/dL (ref 6–20)
CHLORIDE: 99 mmol/L — AB (ref 101–111)
CO2: 28 mmol/L (ref 22–32)
CREATININE: 0.86 mg/dL (ref 0.61–1.24)
Calcium: 8.6 mg/dL — ABNORMAL LOW (ref 8.9–10.3)
GFR calc non Af Amer: >60 mL/min (ref >60)
Glucose, Bld: 112 mg/dL — ABNORMAL HIGH (ref 65–99)
Potassium: 2.8 mmol/L — ABNORMAL LOW (ref 3.5–5.1)
Sodium: 138 mmol/L (ref 135–145)

## 2016-08-26 LAB — TROPONIN I: Troponin I: <0.03 ng/mL (ref <0.03)

## 2016-08-26 LAB — CBC WITH DIFFERENTIAL/PLATELET
BASOS ABS: 0.1 10*3/uL (ref 0–0.1)
Basophils Relative: 1 %
EOS PCT: 1 %
Eosinophils Absolute: 0.1 10*3/uL (ref 0–0.7)
HCT: 38.3 % — ABNORMAL LOW (ref 40.0–52.0)
Hemoglobin: 13 g/dL (ref 13.0–18.0)
LYMPHS PCT: 8 %
Lymphs Abs: 1.2 10*3/uL (ref 1.0–3.6)
MCH: 30.3 pg (ref 26.0–34.0)
MCHC: 33.8 g/dL (ref 32.0–36.0)
MCV: 89.6 fL (ref 80.0–100.0)
MONO ABS: 0.7 10*3/uL (ref 0.2–1.0)
Monocytes Relative: 5 %
Neutro Abs: 13.3 10*3/uL — ABNORMAL HIGH (ref 1.4–6.5)
Neutrophils Relative %: 85 %
PLATELETS: 263 10*3/uL (ref 150–440)
RBC: 4.28 MIL/uL — ABNORMAL LOW (ref 4.40–5.90)
RDW: 14.9 % — AB (ref 11.5–14.5)
WBC: 15.4 10*3/uL — ABNORMAL HIGH (ref 3.8–10.6)

## 2016-08-26 LAB — T4, FREE: Free T4: 1.39 ng/dL — ABNORMAL HIGH (ref 0.61–1.12)

## 2016-08-26 LAB — TSH: TSH: 7.208 u[IU]/mL — ABNORMAL HIGH (ref 0.350–4.500)

## 2016-08-26 LAB — TYPE AND SCREEN
ABO/RH(D): O NEG
Antibody Screen: NEGATIVE

## 2016-08-26 LAB — MAGNESIUM: MAGNESIUM: 1.6 mg/dL — AB (ref 1.7–2.4)

## 2016-08-26 LAB — PROTIME-INR
INR: 1.79
Prothrombin Time: 21 seconds — ABNORMAL HIGH (ref 11.4–15.2)

## 2016-08-26 LAB — POTASSIUM: POTASSIUM: 4 mmol/L (ref 3.5–5.1)

## 2016-08-26 LAB — MRSA PCR SCREENING: MRSA by PCR: NEGATIVE

## 2016-08-26 MED ORDER — MORPHINE SULFATE (PF) 2 MG/ML IV SOLN
INTRAVENOUS | Status: AC
  Administered 2016-08-26: 2 mg via INTRAVENOUS
  Filled 2016-08-26: qty 1

## 2016-08-26 MED ORDER — POTASSIUM CHLORIDE IN NACL 20-0.9 MEQ/L-% IV SOLN
INTRAVENOUS | Status: DC
  Administered 2016-08-26: 16:00:00 via INTRAVENOUS
  Filled 2016-08-26 (×2): qty 1000

## 2016-08-26 MED ORDER — POTASSIUM CHLORIDE 10 MEQ/100ML IV SOLN
10.0000 meq | INTRAVENOUS | Status: AC
  Administered 2016-08-26 (×4): 10 meq via INTRAVENOUS
  Filled 2016-08-26 (×4): qty 100

## 2016-08-26 MED ORDER — POTASSIUM CHLORIDE CRYS ER 20 MEQ PO TBCR
40.0000 meq | EXTENDED_RELEASE_TABLET | ORAL | Status: AC
  Administered 2016-08-26: 40 meq via ORAL
  Filled 2016-08-26: qty 2

## 2016-08-26 MED ORDER — DIVALPROEX SODIUM 250 MG PO DR TAB
250.0000 mg | DELAYED_RELEASE_TABLET | Freq: Two times a day (BID) | ORAL | Status: DC
  Administered 2016-08-26 – 2016-08-27 (×2): 250 mg via ORAL
  Filled 2016-08-26 (×4): qty 1

## 2016-08-26 MED ORDER — POTASSIUM CHLORIDE 20 MEQ/15ML (10%) PO SOLN
40.0000 meq | ORAL | Status: AC
  Administered 2016-08-26 (×2): 40 meq via ORAL
  Filled 2016-08-26 (×4): qty 30

## 2016-08-26 MED ORDER — RAMIPRIL 5 MG PO CAPS
10.0000 mg | ORAL_CAPSULE | Freq: Every day | ORAL | Status: DC
  Filled 2016-08-26: qty 2

## 2016-08-26 MED ORDER — POTASSIUM CHLORIDE CRYS ER 20 MEQ PO TBCR: 40.0000 meq | EXTENDED_RELEASE_TABLET | ORAL | Status: DC

## 2016-08-26 MED ORDER — DOCUSATE SODIUM 100 MG PO CAPS
100.0000 mg | ORAL_CAPSULE | Freq: Two times a day (BID) | ORAL | Status: DC
  Administered 2016-08-26 – 2016-08-30 (×6): 100 mg via ORAL
  Filled 2016-08-26 (×7): qty 1

## 2016-08-26 MED ORDER — SODIUM CHLORIDE 0.9% FLUSH
3.0000 mL | Freq: Two times a day (BID) | INTRAVENOUS | Status: DC
  Administered 2016-08-28 – 2016-08-29 (×2): 3 mL via INTRAVENOUS

## 2016-08-26 MED ORDER — MORPHINE SULFATE (PF) 2 MG/ML IV SOLN
2.0000 mg | Freq: Once | INTRAVENOUS | Status: AC
  Administered 2016-08-26: 2 mg via INTRAVENOUS

## 2016-08-26 MED ORDER — SERTRALINE HCL 100 MG PO TABS
100.0000 mg | ORAL_TABLET | Freq: Every day | ORAL | Status: DC
  Administered 2016-08-26 – 2016-08-29 (×4): 100 mg via ORAL
  Filled 2016-08-26 (×4): qty 1

## 2016-08-26 MED ORDER — MORPHINE SULFATE (PF) 2 MG/ML IV SOLN
2.0000 mg | Freq: Once | INTRAVENOUS | Status: AC
  Administered 2016-08-26: 2 mg via INTRAVENOUS
  Filled 2016-08-26: qty 1

## 2016-08-26 MED ORDER — MORPHINE SULFATE (PF) 2 MG/ML IV SOLN: 2.0000 mg | INTRAVENOUS | Status: DC | PRN

## 2016-08-26 MED ORDER — VITAMIN B-12 1000 MCG PO TABS
1000.0000 ug | ORAL_TABLET | Freq: Every day | ORAL | Status: DC
  Administered 2016-08-28 – 2016-08-30 (×3): 1000 ug via ORAL
  Filled 2016-08-26 (×3): qty 1

## 2016-08-26 MED ORDER — CEFAZOLIN IN D5W 1 GM/50ML IV SOLN
1.0000 g | Freq: Once | INTRAVENOUS | Status: AC
  Administered 2016-08-27: 1 g via INTRAVENOUS
  Filled 2016-08-26: qty 50

## 2016-08-26 MED ORDER — DONEPEZIL HCL 5 MG PO TABS
10.0000 mg | ORAL_TABLET | Freq: Every day | ORAL | Status: DC
  Administered 2016-08-26 – 2016-08-29 (×4): 10 mg via ORAL
  Filled 2016-08-26 (×4): qty 2

## 2016-08-26 MED ORDER — MORPHINE SULFATE (PF) 2 MG/ML IV SOLN
2.0000 mg | INTRAVENOUS | Status: DC | PRN
  Administered 2016-08-26 – 2016-08-27 (×2): 2 mg via INTRAVENOUS
  Filled 2016-08-26 (×2): qty 1

## 2016-08-26 MED ORDER — ONDANSETRON HCL 4 MG/2ML IJ SOLN
4.0000 mg | Freq: Once | INTRAMUSCULAR | Status: AC
  Administered 2016-08-26: 4 mg via INTRAVENOUS

## 2016-08-26 MED ORDER — ONDANSETRON HCL 4 MG/2ML IJ SOLN
INTRAMUSCULAR | Status: AC
  Administered 2016-08-26: 4 mg via INTRAVENOUS
  Filled 2016-08-26: qty 2

## 2016-08-26 MED ORDER — POTASSIUM CHLORIDE IN NACL 40-0.9 MEQ/L-% IV SOLN
INTRAVENOUS | Status: DC
  Administered 2016-08-26 (×2): 100 mL/h via INTRAVENOUS
  Filled 2016-08-26 (×2): qty 1000

## 2016-08-26 MED ORDER — ONDANSETRON HCL 4 MG/2ML IJ SOLN
4.0000 mg | Freq: Four times a day (QID) | INTRAMUSCULAR | Status: DC | PRN
  Administered 2016-08-27: 4 mg via INTRAVENOUS

## 2016-08-26 MED ORDER — VITAMIN K1 1 MG/0.5ML IJ SOLN
1.0000 mg | Freq: Once | INTRAMUSCULAR | Status: AC
  Administered 2016-08-26: 1 mg via SUBCUTANEOUS
  Filled 2016-08-26: qty 0.5

## 2016-08-26 MED ORDER — SIMVASTATIN 20 MG PO TABS
40.0000 mg | ORAL_TABLET | Freq: Every day | ORAL | Status: DC
  Administered 2016-08-27 – 2016-08-29 (×3): 40 mg via ORAL
  Filled 2016-08-26 (×3): qty 2

## 2016-08-26 MED ORDER — SOTALOL HCL 80 MG PO TABS
80.0000 mg | ORAL_TABLET | Freq: Two times a day (BID) | ORAL | Status: DC
  Administered 2016-08-26 – 2016-08-30 (×8): 80 mg via ORAL
  Filled 2016-08-26 (×8): qty 1

## 2016-08-26 MED ORDER — ARIPIPRAZOLE 2 MG PO TABS
2.0000 mg | ORAL_TABLET | Freq: Every day | ORAL | Status: DC
  Administered 2016-08-28 – 2016-08-30 (×3): 2 mg via ORAL
  Filled 2016-08-26 (×3): qty 1

## 2016-08-26 MED ORDER — ONDANSETRON HCL 4 MG PO TABS: 4.0000 mg | ORAL_TABLET | Freq: Four times a day (QID) | ORAL | Status: DC | PRN

## 2016-08-26 MED ORDER — LEVOTHYROXINE SODIUM 50 MCG PO TABS
50.0000 ug | ORAL_TABLET | Freq: Every day | ORAL | Status: DC
  Administered 2016-08-27: 50 ug via ORAL
  Filled 2016-08-26: qty 1

## 2016-08-26 NOTE — Clinical Social Work Note (Addendum)
Clinical Social Work Assessment  Patient Details  Name: Steve Aguirre MRN: [id number] Date of Birth: 1927/08/11  Date of referral:  08/26/16               Reason for consult:  Facility Placement                Permission sought to share information with:  Chartered certified accountant granted to share information::  Yes, Verbal Permission Granted  Name::      [place]::   [place]   Relationship::     Contact Information:     Housing/Transportation Living arrangements for the past 2 months:  [place] of Information:  Spouse Patient Interpreter Needed:  None Criminal Activity/Legal Involvement Pertinent to Current Situation/Hospitalization:  No - Comment as needed Significant Relationships:  Adult Children, Spouse Lives with:  Facility Resident Do you feel safe going back to the place where you live?    Need for family participation in patient care:  Yes (Comment)  Care giving concerns:  Patient is a long term care resident at [place].    Social Worker assessment / plan:  Holiday representative (CSW) reviewed chart and noted that patient is from [facility] and has a hip fracture. CSW attempted to meet with patient however he appeared confused and not alert and oriented. CSW contacted patient's wife [name] to complete assessment. Per wife patient has been at [facility] for 2 months. Wife reported that she took care of patient at home for 1 year. Per wife patient is privatly paying [employer]. CSW discussed that patient will likely need to go to a SNF for short term rehab before returning to [facility]. CSW explained that patient will require a 3 night qualifying inpatient stay in order for Medicare to cover SNF. Wife verbalized her understanding. Wife is agreeable to SNF search and prefers [facility]   FL2 complete and faxed out. CSW will continue to follow and assist as needed.    Employment status:  Disabled (Comment on whether or not currently receiving Disability), Retired Forensic scientist:  Medicare PT Recommendations:  Not assessed at this time Information / Referral to community resources:  [place]  Patient/Family's Response to care:  Patient's wife is agreeable to [employer] and prefers [facility] [facility]   Patient/Family's Understanding of and Emotional Response to Diagnosis, Current Treatment, and Prognosis:  Patient's wife is very pleasant and thanked CSW for calling.   Emotional Assessment Appearance:  Appears stated age Attitude/Demeanor/Rapport:  Unresponsive Affect (typically observed):  Unable to Assess Orientation:  Oriented to Self, Oriented to Place, Fluctuating Orientation (Suspected and/or reported [place]) Alcohol / Substance use:  Not Applicable Psych involvement (Current and /or in the community):  No (Comment)  Discharge Needs  Concerns to be addressed:  Discharge Planning Concerns Readmission within the last 30 days:  No Current discharge risk:  Dependent with Mobility Barriers to Discharge:  Continued Medical Work up   [employer], Veronia Beets, LCSW 08/26/2016, 1:35 PM

## 2016-08-26 NOTE — ED Notes (Signed)
Daughter Daine Gravel [phone]. Per daughter patient eats selective at meals some trouble due to dementia. Meds are crushed at [facility]. Pt refuses meds at times. Ambulates with RW.

## 2016-08-26 NOTE — Progress Notes (Signed)
Speech Therapy Note: received order, reviewed chart notes. Pt is currently NPO for hip procedure today per NSG. ST services will f/u when appropriate for oral intake. Recommended strict aspiration precautions and oral care to [name].

## 2016-08-26 NOTE — Consult Note (Signed)
Unstable intertrochanteric hip fracture, possible ORIF tomorrow

## 2016-08-26 NOTE — ED Notes (Signed)
Son in law at the bedside.

## 2016-08-26 NOTE — H&P (Signed)
Steve Aguirre is an 82 y.o. male.   Chief Complaint: Fall HPI: The patient with past medical history of dementia, atrial fibrillation and hypertension presents to the emergency department after suffering a mechanical fall. The patient reportedly complained of pain in his right hip area. X-ray of the hip in the emergency department revealed a mildly displaced intertrochanteric fracture of the femoral neck. The patient surgery was consulted who recommended admission to the hospitalist service prior to surgical consult.  Past Medical History:  Diagnosis Date  . Arthritis   . Atrial fibrillation ([place])   . Cancer [facility])    prostate  . Coronary artery disease   . Hyperlipemia   . Hypertension   . Scarlet fever     Past Surgical History:  Procedure Laterality Date  . CARDIAC CATHETERIZATION    . CATARACT EXTRACTION W/ INTRAOCULAR LENS  IMPLANT, BILATERAL Bilateral   . CORONARY ARTERY BYPASS GRAFT    . JOINT REPLACEMENT     eft knee  . NASAL SINUS SURGERY    . PACEMAKER INSERTION N/A 01/26/2016   Procedure: INSERTION PACEMAKER;  Surgeon: Isaias Cowman, MD;  Location: [facility] ORS;  Service: Cardiovascular;  Laterality: N/A;  . PROSTATE SURGERY      History reviewed. No pertinent family history. Patient cannot contribute to his history Social History:  reports that he has quit smoking. He has never used smokeless tobacco. He reports that he does not drink alcohol or use drugs.  Allergies: No Known Allergies  Medications Prior to Admission  Medication Sig Dispense Refill  . acetaminophen (TYLENOL) 500 MG tablet Take 500 mg by mouth every 6 (six) hours as needed.    . ARIPiprazole (ABILIFY) 2 MG tablet Take 2 mg by mouth daily.    . divalproex (DEPAKOTE) 125 MG DR tablet Take 250 mg by mouth 2 (two) times daily.     Marland Kitchen donepezil (ARICEPT) 10 MG tablet Take 10 mg by mouth at bedtime.    Marland Kitchen levothyroxine (SYNTHROID, LEVOTHROID) 50 MCG tablet Take 50 mcg by mouth daily before breakfast.     . Melatonin-Pyridoxine (MELATIN PO) Take 3 mg by mouth at bedtime.    . Multiple Vitamin (THEREMS PO) Take 1 tablet by mouth daily.    . ramipril (ALTACE) 10 MG capsule Take 10 mg by mouth daily.    . Rivaroxaban (XARELTO) 15 MG TABS tablet Take 15 mg by mouth daily with supper.     . sertraline (ZOLOFT) 100 MG tablet Take 100 mg by mouth at bedtime.    . simvastatin (ZOCOR) 40 MG tablet Take 40 mg by mouth daily at 6 PM.    . sotalol (BETAPACE) 80 MG tablet Take 80 mg by mouth 2 (two) times daily.    . vitamin B-12 (CYANOCOBALAMIN) 1000 MCG tablet Take 1,000 mcg by mouth daily.      Results for orders placed or performed during the hospital encounter of 08/25/16 (from the past 48 hour(s))  CBC with Differential     Status: Abnormal   Collection Time: 08/26/16 12:06 AM  Result Value Ref Range   WBC 15.4 (H) 3.8 - 10.6 K/uL   RBC 4.28 (L) 4.40 - 5.90 MIL/uL   Hemoglobin 13.0 13.0 - 18.0 g/dL   HCT 38.3 (L) 40.0 - 52.0 %   MCV 89.6 80.0 - 100.0 fL   MCH 30.3 26.0 - 34.0 pg   MCHC 33.8 32.0 - 36.0 g/dL   RDW 14.9 (H) 11.5 - 14.5 %   Platelets 263  150 - 440 K/uL   Neutrophils Relative % 85 %   Neutro Abs 13.3 (H) 1.4 - 6.5 K/uL   Lymphocytes Relative 8 %   Lymphs Abs 1.2 1.0 - 3.6 K/uL   Monocytes Relative 5 %   Monocytes Absolute 0.7 0.2 - 1.0 K/uL   Eosinophils Relative 1 %   Eosinophils Absolute 0.1 0 - 0.7 K/uL   Basophils Relative 1 %   Basophils Absolute 0.1 0 - 0.1 K/uL  Protime-INR     Status: Abnormal   Collection Time: 08/26/16 12:06 AM  Result Value Ref Range   Prothrombin Time 21.0 (H) 11.4 - 15.2 seconds   INR 1.79   Type and screen [facility]     Status: None   Collection Time: 08/26/16 12:06 AM  Result Value Ref Range   ABO/RH(D) O NEG    Antibody Screen NEG    Sample Expiration 82/95/6213   Basic metabolic panel     Status: Abnormal   Collection Time: 08/26/16 12:06 AM  Result Value Ref Range   Sodium 138 135 - 145 mmol/L   Potassium  2.8 (L) 3.5 - 5.1 mmol/L   Chloride 99 (L) 101 - 111 mmol/L   CO2 28 22 - 32 mmol/L   Glucose, Bld 112 (H) 65 - 99 mg/dL   BUN 14 6 - 20 mg/dL   Creatinine, Ser 0.86 0.61 - 1.24 mg/dL   Calcium 8.6 (L) 8.9 - 10.3 mg/dL   GFR calc non Af Amer >60 >60 mL/min   GFR calc Af Amer >60 >60 mL/min    Comment: (NOTE) The eGFR has been calculated using the CKD EPI equation. This calculation has not been validated in all clinical situations. eGFR's persistently <60 mL/min signify possible Chronic Kidney Disease.    Anion gap 11 5 - 15  Troponin I     Status: None   Collection Time: 08/26/16 12:06 AM  Result Value Ref Range   Troponin I <0.03 <0.03 ng/mL  TSH     Status: Abnormal   Collection Time: 08/26/16 12:06 AM  Result Value Ref Range   TSH 7.208 (H) 0.350 - 4.500 uIU/mL  MRSA PCR Screening     Status: None   Collection Time: 08/26/16  3:20 AM  Result Value Ref Range   MRSA by PCR NEGATIVE NEGATIVE    Comment:        The GeneXpert MRSA Assay (FDA approved for NASAL specimens only), is one component of a comprehensive MRSA colonization surveillance program. It is not intended to diagnose MRSA infection nor to guide or monitor treatment for MRSA infections.    Dg Chest 1 View  Result Date: 08/26/2016 CLINICAL DATA:  Admission chest radiograph. Acute right femoral intertrochanteric fracture. Initial encounter. EXAM: CHEST 1 VIEW COMPARISON:  Chest radiograph performed 06/20/2016 FINDINGS: The lungs are well-aerated and clear. There is no evidence of focal opacification, pleural effusion or pneumothorax. The cardiomediastinal silhouette is normal in size. The patient is status post median sternotomy, with evidence of prior CABG. A pacemaker is noted overlying the left chest wall, with a single lead ending overlying the right ventricle. No acute osseous abnormalities are seen. IMPRESSION: No acute cardiopulmonary process seen. No displaced rib fractures identified. Electronically Signed    By: Garald Balding M.D.   On: 08/26/2016 00:10   Ct Head Wo Contrast  Result Date: 08/26/2016 CLINICAL DATA:  Status post fall.  History of Alzheimer disease. EXAM: CT HEAD WITHOUT CONTRAST TECHNIQUE: Contiguous axial  images were obtained from the base of the skull through the vertex without intravenous contrast. COMPARISON:  Head CT 02/03/2016 FINDINGS: Brain: No mass lesion, intraparenchymal hemorrhage or extra-axial collection. No evidence of acute cortical infarct. There is moderate atrophy and confluent periventricular hypoattenuation compatible with chronic microvascular disease. Mineralization along the bilateral occipital cortices is unchanged, likely laminar necrosis. 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 There is atherosclerotic calcification within the proximal intracranial internal carotid arteries. No hyperdense vessel sign. Skull: Normal visualized skull base, calvarium and extracranial soft tissues. Sinuses/Orbits: Postsurgical changes of the paranasal sinuses. No fluid levels. No mastoid effusion. Normal orbits. IMPRESSION: 1. No acute intracranial abnormality. 2. Moderate atrophy and findings of chronic microvascular disease. 3. 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 Electronically Signed   By: Ulyses Jarred M.D.   On: 08/26/2016 00:10   Dg Hip Unilat  With Pelvis 2-3 Views Right  Result Date: 08/26/2016 CLINICAL DATA:  82 year old male with fall and right hip pain. EXAM: DG HIP (WITH OR WITHOUT PELVIS) 2-3V RIGHT COMPARISON:  None. FINDINGS: There is a mildly displaced intertrochanteric fracture of the right femur. There is mild proximal migration of the femoral shaft. No other fracture identified. The bones are osteopenic. There is no dislocation. There is moderate osteoarthritic changes of the hips bilaterally. The soft  tissues appear unremarkable. IMPRESSION: Mildly displaced intertrochanteric fracture of the right femoral neck. Electronically Signed   By: Anner Crete M.D.   On: 08/26/2016 00:09    Review of Systems  Unable to perform ROS: Dementia    Blood pressure (!) 172/71, pulse 66, temperature 97.6 F (36.4 C), temperature source Oral, resp. rate 19, height _0  (1.676 m), weight 57.6 kg (127 lb), SpO2 97 %. Physical Exam  Nursing note and vitals reviewed. Constitutional: He is oriented to person, place, and time. He appears well-developed and well-nourished. No distress.  HENT:  Head: Normocephalic and atraumatic.  Mouth/Throat: Oropharynx is clear and moist.  Eyes: Conjunctivae and EOM are normal. Pupils are equal, round, and reactive to light. No scleral icterus.  Neck: Normal range of motion. Neck supple. No JVD present. No tracheal deviation present. No thyromegaly present.  Cardiovascular: Normal rate, regular rhythm and normal heart sounds.  Exam reveals no gallop and no friction rub.   No murmur heard. Respiratory: Effort normal and breath sounds normal. No respiratory distress.  GI: Soft. Bowel sounds are normal. He exhibits no distension. There is no tenderness.  Genitourinary:  Genitourinary Comments: Deferred  Musculoskeletal: He exhibits no edema.  Right leg slightly externally rotated  Lymphadenopathy:    He has no cervical adenopathy.  Neurological: He is alert and oriented to person, place, and time. No cranial nerve deficit.  Skin: Skin is warm and dry. No rash noted. No erythema.  Psychiatric: He has a normal mood and affect. His behavior is normal.  Due to dementia judgment is impaired and thought content is sometimes disordered.     Assessment/Plan This is an 82 year old male admitted for right hip fracture. 1. Hip fracture: Right femoral neck. Manage pain. The patient has coronary artery disease status post bypass surgery as well as pacemaker placement. He denies  chest pain or shortness of breath. No indication of myocardial ischemia or active lung disease at this time. Due to advanced age the patient is a moderate to high surgical risk but does  not have any cardiac contraindications to thoracic surgery. However due to his dementia and low likelihood of significant functional improvement following surgery he is not a good surgical candidate. 2. Atrial fibrillation: Rate controlled. Continue sotalol. The patient is currently on Xarelto. Hold oral anticoagulant for 2 days prior to surgery. If orthopedic service does not wish to proceed with surgical hip repair restart anticoagulation. 3. Hypertension: Uncontrolled likely secondary to pain. Continue ramipril per home regimen. 4. Hypothyroidism: Continue Synthroid 5. Dementia: Continue Aricept. Nursing concerns for dysphasia. Bedside swallow assay area speech therapy consult if the patient must remain nothing by mouth due to aspiration risk. 6. Mood disorder: Continue Zoloft, Abilify and Depakote 7. Hyperlipidemia: Continue Zocor 8. Hypokalemia: Replete electrolytes 9. DVT prophylaxis: SCDs 10. GI prophylaxis: None The patient is a full code. Time spent on admission orders and patient care approximately 45 minutes  Harrie Foreman, MD 08/26/2016, 5:32 AM

## 2016-08-26 NOTE — Clinical Social Work Placement (Signed)
   CLINICAL SOCIAL WORK PLACEMENT  NOTE  Date:  08/26/2016  Patient Details  Name: Steve Aguirre MRN: [id number] Date of Birth: [birth year]  Clinical Social Work is seeking post-discharge placement for this patient at the [facility] level of care (*CSW will initial, date and re-position this form in  chart as items are completed):  Yes   Patient/family provided with [facility] Department's list of facilities offering this level of care within the geographic area requested by the patient (or if unable, by the patient's family).  Yes   Patient/family informed of their freedom to choose among providers that offer the needed level of care, that participate in Medicare, Medicaid or managed care program needed by the patient, have an available bed and are willing to accept the patient.  Yes   Patient/family informed of [facility]'s ownership interest in [facility] and [facility], as well as of the fact that they are under no obligation to receive care at these facilities.  PASRR submitted to EDS on       PASRR number received on       Existing PASRR number confirmed on 08/26/16     FL2 transmitted to all facilities in geographic area requested by pt/family on 08/26/16     FL2 transmitted to all facilities within larger geographic area on       Patient informed that his/her managed care company has contracts with or will negotiate with certain facilities, including the following:            Patient/family informed of bed offers received.  Patient chooses bed at       Physician recommends and patient chooses bed at      Patient to be transferred to   on  .  Patient to be transferred to facility by       Patient family notified on   of transfer.  Name of family member notified:        PHYSICIAN       Additional Comment:    _______________________________________________ Vayden Weinand, Veronia Beets, LCSW 08/26/2016, 1:34 PM

## 2016-08-26 NOTE — Progress Notes (Addendum)
[facility] [place] NAME: Steve Aguirre    MR#:  [id number]  DATE OF BIRTH:  [birth year]  SUBJECTIVE:  CHIEF COMPLAINT:   Chief Complaint  Patient presents with  . Fall  . Hip Pain    right  The patient is a 82 year old Caucasian male with past medical history significant for history of dementia, atrial fibrillation, prostate cancer, cannot do disease, hypertension, hyperlipidemia, who presents to the hospital with right hip pain after fall. An x-ray done in the emergency room revealed displaced intertrochanteric fracture of femoral neck, orthopedic surgery was consulted, ORIF was recommended. Patient denies any significant discomfort at present  Review of Systems  Unable to perform ROS: Dementia    VITAL SIGNS: Blood pressure 122/61, pulse 73, temperature 97.9 F (36.6 C), temperature source Oral, resp. rate 16, height 5\' 6"  (1.676 m), weight 57.6 kg (127 lb), SpO2 99 %.  PHYSICAL EXAMINATION:   GENERAL:  82 y.o.-year-old patient lying in the bed with no acute distress. Confused, but able to answer some questions and able to follow commands intermittently  EYES: Pupils equal, round, reactive to light and accommodation. No scleral icterus. Extraocular muscles intact.  HEENT: Head atraumatic, normocephalic. Oropharynx and nasopharynx clear.  NECK:  Supple, no jugular venous distention. No thyroid enlargement, no tenderness.  LUNGS: Normal breath sounds bilaterally, no wheezing, rales,rhonchi or crepitation. No use of accessory muscles of respiration.  CARDIOVASCULAR: S1, S2 , irregularly irregular. No murmurs, rubs, or gallops.  ABDOMEN: Soft, nontender, nondistended. Bowel sounds present. No organomegaly or mass.  EXTREMITIES: No pedal edema, cyanosis, or clubbing.  NEUROLOGIC: Cranial nerves II through XII are intact. Muscle strength 5/5 in all extremities. Sensation intact. Gait not checked.  PSYCHIATRIC: The patient is alert and  oriented x 3.  SKIN: No obvious rash, lesion, or ulcer.   ORDERS/RESULTS REVIEWED:   CBC  Recent Labs Lab 08/26/16 0006  WBC 15.4*  HGB 13.0  HCT 38.3*  PLT 263  MCV 89.6  MCH 30.3  MCHC 33.8  RDW 14.9*  LYMPHSABS 1.2  MONOABS 0.7  EOSABS 0.1  BASOSABS 0.1   ------------------------------------------------------------------------------------------------------------------  Chemistries   Recent Labs Lab 08/26/16 0006  NA 138  K 2.8*  CL 99*  CO2 28  GLUCOSE 112*  BUN 14  CREATININE 0.86  CALCIUM 8.6*  MG 1.6*   ------------------------------------------------------------------------------------------------------------------ estimated creatinine clearance is 50.2 mL/min (by C-G formula based on SCr of 0.86 mg/dL). ------------------------------------------------------------------------------------------------------------------  Recent Labs  08/26/16 0006  TSH 7.208*    Cardiac Enzymes  Recent Labs Lab 08/26/16 0006  TROPONINI <0.03   ------------------------------------------------------------------------------------------------------------------ Invalid input(s): POCBNP ---------------------------------------------------------------------------------------------------------------  RADIOLOGY: Dg Chest 1 View  Result Date: 08/26/2016 CLINICAL DATA:  Admission chest radiograph. Acute right femoral intertrochanteric fracture. Initial encounter. EXAM: CHEST 1 VIEW COMPARISON:  Chest radiograph performed 06/20/2016 FINDINGS: The lungs are well-aerated and clear. There is no evidence of focal opacification, pleural effusion or pneumothorax. The cardiomediastinal silhouette is normal in size. The patient is status post median sternotomy, with evidence of prior CABG. A pacemaker is noted overlying the left chest wall, with a single lead ending overlying the right ventricle. No acute osseous abnormalities are seen. IMPRESSION: No acute cardiopulmonary process seen.  No displaced rib fractures identified. Electronically Signed   By: Garald Balding M.D.   On: 08/26/2016 00:10   Ct Head Wo Contrast  Result Date: 08/26/2016 CLINICAL DATA:  Status post fall.  History of Alzheimer disease. EXAM: CT HEAD WITHOUT  CONTRAST TECHNIQUE: Contiguous axial images were obtained from the base of the skull through the vertex without intravenous contrast. COMPARISON:  Head CT 02/03/2016 FINDINGS: Brain: No mass lesion, intraparenchymal hemorrhage or extra-axial collection. No evidence of acute cortical infarct. There is moderate atrophy and confluent periventricular hypoattenuation compatible with chronic microvascular disease. Mineralization along the bilateral occipital cortices is unchanged, likely laminar necrosis. 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 There is atherosclerotic calcification within the proximal intracranial internal carotid arteries. No hyperdense vessel sign. Skull: Normal visualized skull base, calvarium and extracranial soft tissues. Sinuses/Orbits: Postsurgical changes of the paranasal sinuses. No fluid levels. No mastoid effusion. Normal orbits. IMPRESSION: 1. No acute intracranial abnormality. 2. Moderate atrophy and findings of chronic microvascular disease. 3. 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 Electronically Signed   By: Ulyses Jarred M.D.   On: 08/26/2016 00:10   Dg Hip Unilat  With Pelvis 2-3 Views Right  Result Date: 08/26/2016 CLINICAL DATA:  82 year old male with fall and right hip pain. EXAM: DG HIP (WITH OR WITHOUT PELVIS) 2-3V RIGHT COMPARISON:  None. FINDINGS: There is a mildly displaced intertrochanteric fracture of the right femur. There is mild proximal migration of the femoral shaft. No other fracture identified. The bones are osteopenic. There is no dislocation. There is moderate  osteoarthritic changes of the hips bilaterally. The soft tissues appear unremarkable. IMPRESSION: Mildly displaced intertrochanteric fracture of the right femoral neck. Electronically Signed   By: Anner Crete M.D.   On: 08/26/2016 00:09    EKG:  Orders placed or performed during the hospital encounter of 08/25/16  . EKG 12-Lead  . EKG 12-Lead    ASSESSMENT AND PLAN:  Active Problems:   Closed right hip fracture (HCC) #1 intertrochanteric right hip fracture, patient has moderate risk for perioperative complications. Continue pain management, or to surgery is consulted, possible surgery tomorrow, continue  teds and SCDs  #2. Hypokalemia, supplemented intravenously, orally, follow potassium level later today  #3. Hypomagnesemia, supplemented intravenously, follow magnesium in the morning  #4. Elevated white cell count, likely due to fracture, no obvious infection, follow white blood cell count in the morning  #5. Malignant essential hypertension, likely due to stress and pain, improved with medications  #6. Dysphagia, supportive therapy, dysphagia diet, SLP, was consulted, appreciated  #7 elevated TSH, check a free T4   Management plans discussed with the patient, family and they are in agreement.   DRUG ALLERGIES: No Known Allergies  CODE STATUS:     Code Status Orders        Start     Ordered   08/26/16 1129  Do not attempt resuscitation (DNR)  Continuous    Question Answer Comment  In the event of cardiac or respiratory ARREST Do not call a "code blue"   In the event of cardiac or respiratory ARREST Do not perform Intubation, CPR, defibrillation or ACLS   In the event of cardiac or respiratory ARREST Use medication by any route, position, wound care, and other measures to relive pain and suffering. May use oxygen, suction and manual treatment of airway obstruction as needed for comfort.      08/26/16 1128    Code Status History    Date Active Date Inactive Code Status  Order ID Comments User Context   08/26/2016  3:04 AM 08/26/2016 11:28 AM Full Code [id number]  Norva Riffle  Marcille Blanco, MD ED   01/26/2016  2:37 PM 01/27/2016  8:13 PM Full Code [id number]  Isaias Cowman, MD Inpatient    Advance Directive Documentation   Flowsheet Row Most Recent Value  Type of Advance Directive  Healthcare Power of Attorney  Pre-existing out of facility DNR order (yellow form or pink MOST form)  No data  "MOST" Form in Place?  No data      TOTAL TIME TAKING CARE OF THIS PATIENT: 40 minutes.    Theodoro Grist M.D on 08/26/2016 at 1:56 PM  Between 7am to 6pm - Pager - [phone]  After 6pm go to [email or web address] - password [email or web address] [facility]  [facility]  Office  [phone]  CC: Primary care physician; [place] Pink, MD

## 2016-08-26 NOTE — Evaluation (Signed)
Clinical/Bedside Swallow Evaluation Patient Details  Name: Steve Aguirre MRN: [id number] Date of Birth: 06/04/1927  Today's Date: 08/26/2016 Time: SLP Start Time (ACUTE ONLY): 1000 SLP Stop Time (ACUTE ONLY): 1100 SLP Time Calculation (min) (ACUTE ONLY): 60 min  Past Medical History:  Past Medical History:  Diagnosis Date  . Arthritis   . Atrial fibrillation ([place])   . Cancer [facility])    prostate  . Coronary artery disease   . Hyperlipemia   . Hypertension   . Scarlet fever    Past Surgical History:  Past Surgical History:  Procedure Laterality Date  . CARDIAC CATHETERIZATION    . CATARACT EXTRACTION W/ INTRAOCULAR LENS  IMPLANT, BILATERAL Bilateral   . CORONARY ARTERY BYPASS GRAFT    . JOINT REPLACEMENT     eft knee  . NASAL SINUS SURGERY    . PACEMAKER INSERTION N/A 01/26/2016   Procedure: INSERTION PACEMAKER;  Surgeon: Isaias Cowman, MD;  Location: [facility] ORS;  Service: Cardiovascular;  Laterality: N/A;  . PROSTATE SURGERY     HPI:  Pt is an 82 y/o male w/ history of dementia, CAD, arthritis, scarlet fever, atrial fibrillation and hypertension presents to the emergency department after suffering a mechanical fall. The patient reportedly complained of pain in his right hip area. X-ray of the hip in the emergency department revealed a mildly displaced intertrochanteric fracture of the femoral neck. The patient surgery was consulted who recommended admission to the hospitalist service prior to surgical consult..    Assessment / Plan / Recommendation Clinical Impression  Pt appears at reduced risk for aspiration following general aspiration precautions w/ oral intake. However, d/t declined Cognitive status/Dementia, pt can have increased risk for aspiration in general d/t decreased awareness as well as need for feeding assistance at meals. Pt was given po trials and appeared to tolerate thin liquids and purees/broken down mech soft foods w/ no immediate, overt s/s of aspiration  noted; no decline in respiratory status or change in vocal quality during/post po trials. Pt exhibited min increased oral phase time w/ increased textures but cleared appropriately given time. He presents w/ confusion(baseline Dementia) but does follow through w/ task of eating/drinking w/ support. Pt does require feeding assistance w/ reduced distractions in his environment during any intake to allow pt to focus on task of eating/drinking. Recommend a Dysphagia 2 diet consistency w/ thin liquids for safe oral intake and to adequately meet nutritional needs. Recommend meds in Puree; aspiration precautions; feeding assistance at all meals. ST services to f/u w/ trials to upgrade when appropriate but this may be an easier food consistency for pt to eat, especially pending surgery. NSG staff updated    Aspiration Risk   (reduced - Mild aspiration risk)    Diet Recommendation  Dysphagia level 2 diet, Thin liquids; aspiration precautions; feeding assistance at meals.  Medication Administration: Crushed with puree (as able)    Other  Recommendations Recommended Consults:  (Dietician consult) Oral Care Recommendations: Oral care BID;Staff/trained caregiver to provide oral care   Follow up Recommendations   (TBD)    Frequency and Duration min 2x/week  2 weeks       Prognosis Prognosis for Safe Diet Advancement: Fair (-Good) Barriers to Reach Goals: Cognitive deficits      Swallow Study   General Date of Onset: 08/25/16 HPI: Pt is an 82 y/o male w/ history of dementia, CAD, arthritis, scarlet fever, atrial fibrillation and hypertension presents to the emergency department after suffering a mechanical fall. The patient reportedly  complained of pain in his right hip area. X-ray of the hip in the emergency department revealed a mildly displaced intertrochanteric fracture of the femoral neck. The patient surgery was consulted who recommended admission to the hospitalist service prior to surgical  consult..  Type of Study: Bedside Swallow Evaluation Previous Swallow Assessment: none indicated Diet Prior to this Study:  (unknown) Temperature Spikes Noted: No (wbc elevated) Respiratory Status: Room air History of Recent Intubation: No Behavior/Cognition: Cooperative;Pleasant mood;Confused;Distractible;Requires cueing (awake) Oral Cavity Assessment: Dry (sticky) Oral Care Completed by SLP: Yes Oral Cavity - Dentition: Adequate natural dentition Vision:  (n/a) Self-Feeding Abilities: Total assist Patient Positioning: Upright in bed Baseline Vocal Quality: Normal;Low vocal intensity Volitional Cough: Cognitively unable to elicit Volitional Swallow: Unable to elicit    Oral/Motor/Sensory Function Overall Oral Motor/Sensory Function:  (adequate during bolus management; Cognitive deficits)   Ice Chips Ice chips: Within functional limits (adequate) Presentation: Spoon (2 trials) Other Comments: min munching pattern w/ ice chips   Thin Liquid Thin Liquid: Within functional limits Presentation: Cup;Straw;Self Fed (assisted w/ holding cup for drinking; ~3-4 ozs total) Other Comments: tended to take 3-4 swallows at a time; pulled back on cup/straw to take breaks in drinking    Nectar Thick Nectar Thick Liquid: Not tested   Honey Thick Honey Thick Liquid: Not tested   Puree Puree: Within functional limits Presentation: Spoon (fed; 2-3 ozs total)   Solid   GO   Solid: Impaired (broken down and moistened well trials) Presentation: Spoon (4 trials) Oral Phase Impairments: Impaired mastication (min munching ) Oral Phase Functional Implications: Prolonged oral transit (min) Pharyngeal Phase Impairments:  (none)         Orinda Kenner, MS, CCC-SLP  Geselle Cardosa 08/26/2016,11:49 AM

## 2016-08-26 NOTE — NC FL2 (Signed)
[place] LEVEL OF CARE SCREENING TOOL     IDENTIFICATION  Patient Name: Steve Aguirre Birthdate: 1927/11/17 Sex: male Admission Date (Current Location): 08/25/2016  [place] and [place] Number:  Engineering geologist and Address:  [facility], [address], [place], [place] [address]      Provider Number: [id number]  Attending Physician Name and Address:  Theodoro Grist, MD  Relative Name and Phone Number:       Current Level of Care: Hospital Recommended Level of Care: [place] Prior Approval Number:    Date Approved/Denied:   PASRR Number:  ( [id number] )  Discharge Plan: SNF    Current Diagnoses: Patient Active Problem List   Diagnosis Date Noted  . Closed right hip fracture ([place]) 08/26/2016  . Atrial fibrillation ([place]) 06/20/2016  . HTN (hypertension) 06/20/2016  . Dyslipidemia 06/20/2016  . Alzheimer's dementia without behavioral disturbance 06/20/2016  . Sick sinus syndrome ([place]) 01/26/2016    Orientation RESPIRATION BLADDER Height & Weight     Self, Time, Situation, Place  Normal Incontinent Weight: 127 lb (57.6 kg) Height:  5\' 6"  (167.6 cm)  BEHAVIORAL SYMPTOMS/MOOD NEUROLOGICAL BOWEL NUTRITION STATUS   (none)  (none) Continent Diet (NPO for surgery )  AMBULATORY STATUS COMMUNICATION OF NEEDS Skin   Extensive Assist Verbally Surgical wounds                       Personal Care Assistance Level of Assistance  Bathing, Feeding, Dressing Bathing Assistance: Limited assistance Feeding assistance: Independent Dressing Assistance: Limited assistance     Functional Limitations Info  Sight, Hearing, Speech Sight Info: Adequate Hearing Info: Impaired Speech Info: Adequate    SPECIAL CARE FACTORS FREQUENCY  PT (By licensed PT), OT (By licensed OT)     PT Frequency:  (5) OT Frequency:  (5)            Contractures      Additional Factors Info  Code Status, Allergies Code Status  Info:  (Full Code. ) Allergies Info:  (No Known Allergies. )           Current Medications (08/26/2016):  This is the current hospital active medication list Current Facility-Administered Medications  Medication Dose Route Frequency Provider Last Rate Last Dose  . 0.9 % NaCl with KCl 40 mEq / L  infusion   Intravenous Continuous Harrie Foreman, MD 100 mL/hr at 08/26/16 0905 100 mL/hr at 08/26/16 0905  . ARIPiprazole (ABILIFY) tablet 2 mg  2 mg Oral Daily Harrie Foreman, MD      . divalproex (DEPAKOTE) DR tablet 250 mg  250 mg Oral BID Harrie Foreman, MD      . docusate sodium (COLACE) capsule 100 mg  100 mg Oral BID Harrie Foreman, MD      . donepezil (ARICEPT) tablet 10 mg  10 mg Oral QHS Harrie Foreman, MD      . levothyroxine (SYNTHROID, LEVOTHROID) tablet 50 mcg  50 mcg Oral QAC breakfast Harrie Foreman, MD      . morphine 2 MG/ML injection 2 mg  2 mg Intravenous Q4H PRN Harrie Foreman, MD      . ondansetron [facility]) tablet 4 mg  4 mg Oral Q6H PRN Harrie Foreman, MD       Or  . ondansetron [facility]) injection 4 mg  4 mg Intravenous Q6H PRN Harrie Foreman, MD      . potassium  chloride 10 mEq in 100 mL IVPB  10 mEq Intravenous Q1 Hr x 4 Harrie Foreman, MD   10 mEq at 08/26/16 [id number]  . potassium chloride SA (K-DUR,KLOR-CON) CR tablet 40 mEq  40 mEq Oral Q4H Theodoro Grist, MD      . ramipril (ALTACE) capsule 10 mg  10 mg Oral Daily Harrie Foreman, MD      . sertraline (ZOLOFT) tablet 100 mg  100 mg Oral QHS Harrie Foreman, MD      . simvastatin (ZOCOR) tablet 40 mg  40 mg Oral q1800 Harrie Foreman, MD      . sodium chloride flush (NS) 0.9 % injection 3 mL  3 mL Intravenous Q12H Harrie Foreman, MD      . sotalol (BETAPACE) tablet 80 mg  80 mg Oral BID Harrie Foreman, MD      . vitamin B-12 (CYANOCOBALAMIN) tablet 1,000 mcg  1,000 mcg Oral Daily Harrie Foreman, MD         Discharge Medications: Please see discharge summary for a list of  discharge medications.  Relevant Imaging Results:  Relevant Lab Results:   Additional Information  (SSN: [id number])  Gabrelle Roca, Veronia Beets, LCSW

## 2016-08-26 NOTE — Care Management Note (Addendum)
Case Management Note  Patient Details  Name: Steve Aguirre MRN: [id number] Date of Birth: 31-May-1927  Subjective/Objective:    Spoke with spouse Steve Aguirre) at the bedside , patient is from [place] [facility]. He will have surgery tomorrow.                 Action/Plan:Anticpated discharge is SNF at discharge. Awaiting surgery.    Expected Discharge Date:                  Expected Discharge Plan:  Skilled Nursing Facility  In-House Referral:  Clinical Social Work  Discharge planning Services  CM Consult  Post Acute Care Choice:    Choice offered to:     DME Arranged:    DME Agency:     HH Arranged:    [place] Agency:     Status of Service:  In process, will continue to follow  If discussed at Long Length of Stay Meetings, dates discussed:    Additional Comments:  Alvie Heidelberg, RN 08/26/2016, 4:31 PM

## 2016-08-27 ENCOUNTER — Inpatient Hospital Stay: Payer: Medicare Other

## 2016-08-27 ENCOUNTER — Encounter: Payer: Self-pay | Admitting: *Deleted

## 2016-08-27 ENCOUNTER — Encounter
Admission: EM | Disposition: A | Discharge status: Skilled Nursing Facility | Payer: Self-pay | Source: Home / Self Care | Attending: Internal Medicine

## 2016-08-27 ENCOUNTER — Inpatient Hospital Stay: Payer: Medicare Other | Admitting: Anesthesiology

## 2016-08-27 HISTORY — PX: INTRAMEDULLARY (IM) NAIL INTERTROCHANTERIC: SHX5875

## 2016-08-27 LAB — BASIC METABOLIC PANEL
Anion gap: 5 (ref 5–15)
BUN: 12 mg/dL (ref 6–20)
CHLORIDE: 107 mmol/L (ref 101–111)
CO2: 26 mmol/L (ref 22–32)
Calcium: 8.4 mg/dL — ABNORMAL LOW (ref 8.9–10.3)
Creatinine, Ser: 0.8 mg/dL (ref 0.61–1.24)
GFR calc Af Amer: >60 mL/min (ref >60)
GFR calc non Af Amer: >60 mL/min (ref >60)
Glucose, Bld: 141 mg/dL — ABNORMAL HIGH (ref 65–99)
POTASSIUM: 4.9 mmol/L (ref 3.5–5.1)
SODIUM: 138 mmol/L (ref 135–145)

## 2016-08-27 LAB — CBC
HEMATOCRIT: 28.9 % — AB (ref 40.0–52.0)
Hemoglobin: 10 g/dL — ABNORMAL LOW (ref 13.0–18.0)
MCH: 30.9 pg (ref 26.0–34.0)
MCHC: 34.8 g/dL (ref 32.0–36.0)
MCV: 89 fL (ref 80.0–100.0)
Platelets: 199 10*3/uL (ref 150–440)
RBC: 3.25 MIL/uL — ABNORMAL LOW (ref 4.40–5.90)
RDW: 15 % — AB (ref 11.5–14.5)
WBC: 10.9 10*3/uL — AB (ref 3.8–10.6)

## 2016-08-27 SURGERY — FIXATION, FRACTURE, INTERTROCHANTERIC, WITH INTRAMEDULLARY ROD
Anesthesia: General | Site: Hip | Laterality: Right | Wound class: Clean

## 2016-08-27 MED ORDER — CEFAZOLIN SODIUM-DEXTROSE 2-4 GM/100ML-% IV SOLN
2.0000 g | Freq: Four times a day (QID) | INTRAVENOUS | Status: AC
  Administered 2016-08-27 – 2016-08-28 (×3): 2 g via INTRAVENOUS
  Filled 2016-08-27 (×4): qty 100

## 2016-08-27 MED ORDER — MAGNESIUM HYDROXIDE 400 MG/5ML PO SUSP: 30.0000 mL | Freq: Every day | ORAL | Status: DC | PRN

## 2016-08-27 MED ORDER — NEOMYCIN-POLYMYXIN B GU 40-200000 IR SOLN
Status: DC | PRN
  Administered 2016-08-27: 2 mL

## 2016-08-27 MED ORDER — ALUM & MAG HYDROXIDE-SIMETH 200-200-20 MG/5ML PO SUSP: 30.0000 mL | ORAL | Status: DC | PRN

## 2016-08-27 MED ORDER — PHENOL 1.4 % MT LIQD
1.0000 | OROMUCOSAL | Status: DC | PRN
  Filled 2016-08-27: qty 177

## 2016-08-27 MED ORDER — IPRATROPIUM-ALBUTEROL 0.5-2.5 (3) MG/3ML IN SOLN: 3.0000 mL | RESPIRATORY_TRACT | Status: DC | PRN

## 2016-08-27 MED ORDER — DEXAMETHASONE SODIUM PHOSPHATE 10 MG/ML IJ SOLN
INTRAMUSCULAR | Status: DC | PRN
Start: 2016-08-27 — End: 2016-08-27
  Administered 2016-08-27: 10 mg via INTRAVENOUS

## 2016-08-27 MED ORDER — BISACODYL 10 MG RE SUPP: 10.0000 mg | Freq: Every day | RECTAL | Status: DC | PRN

## 2016-08-27 MED ORDER — OXYCODONE HCL 5 MG PO TABS: 5.0000 mg | ORAL_TABLET | Freq: Once | ORAL | Status: DC | PRN

## 2016-08-27 MED ORDER — GLYCOPYRROLATE 0.2 MG/ML IJ SOLN
INTRAMUSCULAR | Status: DC | PRN
  Administered 2016-08-27: 0.6 mg via INTRAVENOUS

## 2016-08-27 MED ORDER — TRANEXAMIC ACID 1000 MG/10ML IV SOLN
INTRAVENOUS | Status: AC
  Filled 2016-08-27: qty 10

## 2016-08-27 MED ORDER — LACTATED RINGERS IV SOLN
INTRAVENOUS | Status: DC | PRN
  Administered 2016-08-27: 12:00:00 via INTRAVENOUS

## 2016-08-27 MED ORDER — ROCURONIUM BROMIDE 100 MG/10ML IV SOLN
INTRAVENOUS | Status: DC | PRN
  Administered 2016-08-27: 25 mg via INTRAVENOUS
  Administered 2016-08-27: 5 mg via INTRAVENOUS

## 2016-08-27 MED ORDER — METOCLOPRAMIDE HCL 10 MG PO TABS: 5.0000 mg | ORAL_TABLET | Freq: Three times a day (TID) | ORAL | Status: DC | PRN

## 2016-08-27 MED ORDER — ACETAMINOPHEN 325 MG PO TABS
650.0000 mg | ORAL_TABLET | Freq: Four times a day (QID) | ORAL | Status: DC | PRN
  Administered 2016-08-28: 650 mg via ORAL
  Filled 2016-08-27: qty 2

## 2016-08-27 MED ORDER — MENTHOL 3 MG MT LOZG
1.0000 | LOZENGE | OROMUCOSAL | Status: DC | PRN
  Filled 2016-08-27: qty 9

## 2016-08-27 MED ORDER — PROPOFOL 10 MG/ML IV BOLUS
INTRAVENOUS | Status: DC | PRN
  Administered 2016-08-27: 40 mg via INTRAVENOUS

## 2016-08-27 MED ORDER — NEOSTIGMINE METHYLSULFATE 10 MG/10ML IV SOLN
INTRAVENOUS | Status: DC | PRN
  Administered 2016-08-27: 3 mg via INTRAVENOUS

## 2016-08-27 MED ORDER — OXYCODONE HCL 5 MG/5ML PO SOLN: 5.0000 mg | Freq: Once | ORAL | Status: DC | PRN

## 2016-08-27 MED ORDER — METOCLOPRAMIDE HCL 5 MG/ML IJ SOLN: 5.0000 mg | Freq: Three times a day (TID) | INTRAMUSCULAR | Status: DC | PRN

## 2016-08-27 MED ORDER — RIVAROXABAN 10 MG PO TABS
20.0000 mg | ORAL_TABLET | Freq: Every day | ORAL | Status: DC
  Administered 2016-08-28 – 2016-08-29 (×2): 20 mg via ORAL
  Filled 2016-08-27 (×2): qty 2

## 2016-08-27 MED ORDER — FENTANYL CITRATE (PF) 100 MCG/2ML IJ SOLN
INTRAMUSCULAR | Status: AC
  Administered 2016-08-27: 25 ug via INTRAVENOUS
  Filled 2016-08-27: qty 2

## 2016-08-27 MED ORDER — FENTANYL CITRATE (PF) 100 MCG/2ML IJ SOLN
INTRAMUSCULAR | Status: DC | PRN
  Administered 2016-08-27: 50 ug via INTRAVENOUS

## 2016-08-27 MED ORDER — CEFAZOLIN IN D5W 1 GM/50ML IV SOLN
INTRAVENOUS | Status: AC
Start: 2016-08-27 — End: 2016-08-27
  Administered 2016-08-27: 12:00:00
  Filled 2016-08-27: qty 50

## 2016-08-27 MED ORDER — BUPIVACAINE-EPINEPHRINE (PF) 0.5% -1:200000 IJ SOLN
INTRAMUSCULAR | Status: AC
  Filled 2016-08-27: qty 30

## 2016-08-27 MED ORDER — TRANEXAMIC ACID 1000 MG/10ML IV SOLN
INTRAVENOUS | Status: DC | PRN
  Administered 2016-08-27: 1000 mg via INTRAVENOUS

## 2016-08-27 MED ORDER — LIDOCAINE HCL (CARDIAC) 20 MG/ML IV SOLN
INTRAVENOUS | Status: DC | PRN
  Administered 2016-08-27: 80 mg via INTRAVENOUS

## 2016-08-27 MED ORDER — PHENYLEPHRINE HCL 10 MG/ML IJ SOLN
INTRAMUSCULAR | Status: DC | PRN
  Administered 2016-08-27 (×2): 100 ug via INTRAVENOUS

## 2016-08-27 MED ORDER — MAGNESIUM CITRATE PO SOLN
1.0000 | Freq: Once | ORAL | Status: DC | PRN
  Filled 2016-08-27: qty 296

## 2016-08-27 MED ORDER — SUCCINYLCHOLINE CHLORIDE 20 MG/ML IJ SOLN
INTRAMUSCULAR | Status: DC | PRN
  Administered 2016-08-27: 100 mg via INTRAVENOUS

## 2016-08-27 MED ORDER — SODIUM CHLORIDE 0.9 % IV SOLN
INTRAVENOUS | Status: DC
  Administered 2016-08-27: 10:00:00 via INTRAVENOUS

## 2016-08-27 MED ORDER — SODIUM CHLORIDE 0.9 % IV SOLN: INTRAVENOUS | Status: DC

## 2016-08-27 MED ORDER — RIVAROXABAN 10 MG PO TABS: 10.0000 mg | ORAL_TABLET | Freq: Every day | ORAL | Status: DC

## 2016-08-27 MED ORDER — NEOMYCIN-POLYMYXIN B GU 40-200000 IR SOLN
Status: AC
  Filled 2016-08-27: qty 2

## 2016-08-27 MED ORDER — BUPIVACAINE-EPINEPHRINE (PF) 0.5% -1:200000 IJ SOLN
INTRAMUSCULAR | Status: DC | PRN
  Administered 2016-08-27: 30 mL

## 2016-08-27 MED ORDER — FENTANYL CITRATE (PF) 100 MCG/2ML IJ SOLN
25.0000 ug | INTRAMUSCULAR | Status: DC | PRN
  Administered 2016-08-27: 25 ug via INTRAVENOUS

## 2016-08-27 MED ORDER — HYDROCODONE-ACETAMINOPHEN 5-325 MG PO TABS
1.0000 | ORAL_TABLET | Freq: Four times a day (QID) | ORAL | Status: DC | PRN
  Administered 2016-08-27: 1 via ORAL
  Administered 2016-08-28 – 2016-08-29 (×2): 2 via ORAL
  Filled 2016-08-27 (×2): qty 2
  Filled 2016-08-27: qty 1

## 2016-08-27 SURGICAL SUPPLY — 36 items
BIT DRILL 4.3MMS DISTAL GRDTED (BIT) ×1 IMPLANT
CANISTER SUCT 1200ML W/VALVE (MISCELLANEOUS) ×3 IMPLANT
CHLORAPREP W/TINT 26ML (MISCELLANEOUS) ×3 IMPLANT
DRAPE SHEET LG 3/4 BI-LAMINATE (DRAPES) ×3 IMPLANT
DRAPE SURG 17X11 SM STRL (DRAPES) ×3 IMPLANT
DRAPE U-SHAPE 47X51 STRL (DRAPES) ×3 IMPLANT
DRILL 4.3MMS DISTAL GRADUATED (BIT) ×3
DRSG OPSITE POSTOP 4X6 (GAUZE/BANDAGES/DRESSINGS) ×9 IMPLANT
ELECT REM PT RETURN 9FT ADLT (ELECTROSURGICAL) ×3
ELECTRODE REM PT RTRN 9FT ADLT (ELECTROSURGICAL) ×1 IMPLANT
GAUZE SPONGE 4X4 12PLY STRL (GAUZE/BANDAGES/DRESSINGS) ×3 IMPLANT
GLOVE BIOGEL PI IND STRL 9 (GLOVE) ×1 IMPLANT
GLOVE BIOGEL PI INDICATOR 9 (GLOVE) ×2
GLOVE SURG ORTHO 9.0 STRL STRW (GLOVE) ×3 IMPLANT
GOWN SRG 2XL LVL 4 RGLN SLV (GOWNS) ×1 IMPLANT
GOWN STRL NON-REIN 2XL LVL4 (GOWNS) ×2
GOWN STRL REUS W/ TWL LRG LVL3 (GOWN DISPOSABLE) ×1 IMPLANT
GOWN STRL REUS W/TWL LRG LVL3 (GOWN DISPOSABLE) ×2
GUIDEPIN VERSANAIL DSP 3.2X444 ×3 IMPLANT
GUIDEWIRE BALL NOSE 100CM (WIRE) ×3 IMPLANT
HFN RH 130 DEG 9MM X 340MM (Nail) ×3 IMPLANT
HIP FRAC NAIL LAG SCR 10.5X100 (Orthopedic Implant) ×2 IMPLANT
IV NS 500ML (IV SOLUTION) ×2
IV NS 500ML BAXH (IV SOLUTION) ×1 IMPLANT
KIT RM TURNOVER STRD PROC AR (KITS) ×3 IMPLANT
MAT BLUE FLOOR 46X72 FLO (MISCELLANEOUS) ×3 IMPLANT
NEEDLE FILTER BLUNT 18X 1/2SAF (NEEDLE) ×2
NEEDLE FILTER BLUNT 18X1 1/2 (NEEDLE) ×1 IMPLANT
PACK HIP COMPR (MISCELLANEOUS) ×3 IMPLANT
SCREW BONE CORTICAL 5.0X42 (Screw) ×3 IMPLANT
SCREW CANN THRD AFF 10.5X100 (Orthopedic Implant) ×1 IMPLANT
STAPLER SKIN PROX 35W (STAPLE) ×3 IMPLANT
SUT VIC AB 1 CT1 36 (SUTURE) ×3 IMPLANT
SUT VIC AB 2-0 CT1 (SUTURE) ×3 IMPLANT
SYRINGE 10CC LL (SYRINGE) ×3 IMPLANT
TAPE MICROFOAM 4IN (TAPE) ×3 IMPLANT

## 2016-08-27 NOTE — Progress Notes (Signed)
Pt with mildly elevated temp this AM. MD aware. New orders placed for Neb tx's PRN and tylenol. Reduced number of blankets on bed and adjusted temperature in room. Spoke with Resp. Therapist, recommended incentive spirometer to prevent/improve possible atelectasis. Pt receptive to exercises when prompted, will need reminders to continue due to advanced dementia. Will continue to monitor for breathing and temperature, pt scheduled for surgery late AM.

## 2016-08-27 NOTE — Anesthesia Procedure Notes (Signed)
Procedure Name: Intubation Date/Time: 08/27/2016 12:13 PM Performed by: Courtney Paris Pre-anesthesia Checklist: Patient identified, Patient being monitored, Timeout performed, Emergency Drugs available and Suction available Patient Re-evaluated:Patient Re-evaluated prior to inductionOxygen Delivery Method: Circle system utilized Preoxygenation: Pre-oxygenation with 100% oxygen Intubation Type: IV induction Ventilation: Mask ventilation without difficulty Laryngoscope Size: Mac and 3 Grade View: Grade II Tube type: Oral Tube size: 7.0 mm Number of attempts: 1 Airway Equipment and Method: Stylet Placement Confirmation: ETT inserted through vocal cords under direct vision,  positive ETCO2,  breath sounds checked- equal and bilateral and CO2 detector Secured at: 21 cm Tube secured with: Tape Dental Injury: Teeth and Oropharynx as per pre-operative assessment

## 2016-08-27 NOTE — Anesthesia Procedure Notes (Signed)
Performed by: Monda Chastain       

## 2016-08-27 NOTE — Progress Notes (Signed)
Plan is for patient to have surgery today. Patient will D/C to [facility] Monday 08/30/16 if medically stable. MD aware of above. Clinical Social Worker (CSW) will continue to follow and assist as needed.   [employer], LCSW [phone]

## 2016-08-27 NOTE — Progress Notes (Signed)
Pt returned from surgery, VS stable, no complaints of pain at this time. Incision/dressing clean/dry, pt sleeping. Family notified of pt's return.

## 2016-08-27 NOTE — Transfer of Care (Signed)
Immediate Anesthesia Transfer of Care Note  Patient: Steve Aguirre  Procedure(s) Performed: Procedure(s): INTRAMEDULLARY (IM) NAIL INTERTROCHANTRIC (Right)  Patient Location: PACU  Anesthesia Type:General  Level of Consciousness: awake and patient cooperative  Airway & Oxygen Therapy: Patient Spontanous Breathing and Patient connected to face mask oxygen  Post-op Assessment: Report given to RN and Post -op Vital signs reviewed and stable  Post vital signs: Reviewed and stable  Last Vitals:  Vitals:   08/27/16 0816 08/27/16 1143  BP: (!) 150/71 (!) 143/73  Pulse: 60 60  Resp:  (!) 22  Temp: 37.3 C 36.6 C    Last Pain:  Vitals:   08/27/16 1143  TempSrc: Tympanic  PainSc:          Complications: No apparent anesthesia complications

## 2016-08-27 NOTE — Progress Notes (Signed)
SLP Cancellation Note  Patient Details Name: [name] MRN: [id number] DOB: [birth year]   Cancelled treatment:       Reason Eval/Treat Not Completed: Patient at procedure or test/unavailable. Pt is scheduled for hip surgery later this morning. ST services will f/u w/ toleration of modified oral diet tomorrow. Recommend continue w/ current aspiration precautions and Meds in puree d/t baseline Cognitive decline/Dementia.   Brentin Shin 08/27/2016, 10:38 AM

## 2016-08-27 NOTE — Progress Notes (Signed)
Discussed with Dr. Ether Griffins concerning Xarelto dose.  Patient on Xarelto at SNF for Atrial fibrillation prior to admission. SNF dose was 15mg  Q supper.   Current order for Xarelto 10mg  daily (DVT prophylaxis post-surgery).  Per discussion with MD will transition to Atrial fibrillation dosing of Xarelto 20 mg po daily with supper to start 08/28/16.  Crcl= 58.7 mlmin   Hgb 10.0,  Plt 199.  Chinita Greenland PharmD Clinical Pharmacist 08/27/2016

## 2016-08-27 NOTE — Progress Notes (Signed)
[name] [facility] [place] NAME: Steve Aguirre    MR#:  [id number]  DATE OF BIRTH:  1927-05-18  SUBJECTIVE:  CHIEF COMPLAINT:   Chief Complaint  Patient presents with  . Fall  . Hip Pain    right  The patient is a 82 year old Caucasian male with past medical history significant for history of dementia, atrial fibrillation, prostate cancer, hypertension, hyperlipidemia, who presents to the hospital with right hip pain after fall. An x-ray done in the emergency room revealed displaced intertrochanteric fracture of femoral neck, orthopedic surgery was consulted, ORIF was recommended, Being performed today. Patient's potassium level normalized after supplementation intravenously and orally. Patient denies any significant pain in the right hip at this time.   Review of Systems  Unable to perform ROS: Dementia    VITAL SIGNS: Blood pressure (!) 160/73, pulse 63, temperature 98.4 F (36.9 C), resp. rate 18, height 5\' 6"  (1.676 m), weight 62.6 kg (137 lb 14.4 oz), SpO2 96 %.  PHYSICAL EXAMINATION:   GENERAL:  82 y.o.-year-old patient lying in the bed with no acute distress. Confused, but able to answer some questions and able to follow commands intermittently  EYES: Pupils equal, round, reactive to light and accommodation. No scleral icterus. Extraocular muscles intact.  HEENT: Head atraumatic, normocephalic. Oropharynx and nasopharynx clear.  NECK:  Supple, no jugular venous distention. No thyroid enlargement, no tenderness.  LUNGS: Normal breath sounds bilaterally, no wheezing, rales,rhonchi or crepitation. No use of accessory muscles of respiration.  CARDIOVASCULAR: S1, S2 , irregularly irregular. No murmurs, rubs, or gallops.  ABDOMEN: Soft, nontender, nondistended. Bowel sounds present. No organomegaly or mass.  EXTREMITIES: No pedal edema, cyanosis, or clubbing. Right lower extremity shortened and rotated externally NEUROLOGIC: Cranial  nerves II through XII are intact. Muscle strength 5/5 in all extremities. Sensation intact. Gait not checked.  PSYCHIATRIC: The patient is alert and oriented x 3.  SKIN: No obvious rash, lesion, or ulcer.   ORDERS/RESULTS REVIEWED:   CBC  Recent Labs Lab 08/26/16 0006 08/27/16 0331  WBC 15.4* 10.9*  HGB 13.0 10.0*  HCT 38.3* 28.9*  PLT 263 199  MCV 89.6 89.0  MCH 30.3 30.9  MCHC 33.8 34.8  RDW 14.9* 15.0*  LYMPHSABS 1.2  --   MONOABS 0.7  --   EOSABS 0.1  --   BASOSABS 0.1  --    ------------------------------------------------------------------------------------------------------------------  Chemistries   Recent Labs Lab 08/26/16 0006 08/26/16 1335 08/27/16 0331  NA 138  --  138  K 2.8* 4.0 4.9  CL 99*  --  107  CO2 28  --  26  GLUCOSE 112*  --  141*  BUN 14  --  12  CREATININE 0.86  --  0.80  CALCIUM 8.6*  --  8.4*  MG 1.6*  --   --    ------------------------------------------------------------------------------------------------------------------ estimated creatinine clearance is 58.7 mL/min (by C-G formula based on SCr of 0.8 mg/dL). ------------------------------------------------------------------------------------------------------------------  Recent Labs  08/26/16 0006  TSH 7.208*    Cardiac Enzymes  Recent Labs Lab 08/26/16 0006  TROPONINI <0.03   ------------------------------------------------------------------------------------------------------------------ Invalid input(s): POCBNP ---------------------------------------------------------------------------------------------------------------  RADIOLOGY: Dg Chest 1 View  Result Date: 08/26/2016 CLINICAL DATA:  Admission chest radiograph. Acute right femoral intertrochanteric fracture. Initial encounter. EXAM: CHEST 1 VIEW COMPARISON:  Chest radiograph performed 06/20/2016 FINDINGS: The lungs are well-aerated and clear. There is no evidence of focal opacification, pleural effusion or  pneumothorax. The cardiomediastinal silhouette is normal in size. The patient is  status post median sternotomy, with evidence of prior CABG. A pacemaker is noted overlying the left chest wall, with a single lead ending overlying the right ventricle. No acute osseous abnormalities are seen. IMPRESSION: No acute cardiopulmonary process seen. No displaced rib fractures identified. Electronically Signed   By: Garald Balding M.D.   On: 08/26/2016 00:10   Ct Head Wo Contrast  Result Date: 08/26/2016 CLINICAL DATA:  Status post fall.  History of Alzheimer disease. EXAM: CT HEAD WITHOUT CONTRAST TECHNIQUE: Contiguous axial images were obtained from the base of the skull through the vertex without intravenous contrast. COMPARISON:  Head CT 02/03/2016 FINDINGS: Brain: No mass lesion, intraparenchymal hemorrhage or extra-axial collection. No evidence of acute cortical infarct. There is moderate atrophy and confluent periventricular hypoattenuation compatible with chronic microvascular disease. Mineralization along the bilateral occipital cortices is unchanged, likely laminar necrosis. 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 There is atherosclerotic calcification within the proximal intracranial internal carotid arteries. No hyperdense vessel sign. Skull: Normal visualized skull base, calvarium and extracranial soft tissues. Sinuses/Orbits: Postsurgical changes of the paranasal sinuses. No fluid levels. No mastoid effusion. Normal orbits. IMPRESSION: 1. No acute intracranial abnormality. 2. Moderate atrophy and findings of chronic microvascular disease. 3. 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 Electronically Signed   By: Ulyses Jarred M.D.   On: 08/26/2016 00:10   Dg Hip Unilat  With Pelvis 2-3 Views Right  Result Date: 08/26/2016 CLINICAL DATA:  82 year old  male with fall and right hip pain. EXAM: DG HIP (WITH OR WITHOUT PELVIS) 2-3V RIGHT COMPARISON:  None. FINDINGS: There is a mildly displaced intertrochanteric fracture of the right femur. There is mild proximal migration of the femoral shaft. No other fracture identified. The bones are osteopenic. There is no dislocation. There is moderate osteoarthritic changes of the hips bilaterally. The soft tissues appear unremarkable. IMPRESSION: Mildly displaced intertrochanteric fracture of the right femoral neck. Electronically Signed   By: Anner Crete M.D.   On: 08/26/2016 00:09    EKG:  Orders placed or performed during the hospital encounter of 08/25/16  . EKG 12-Lead  . EKG 12-Lead    ASSESSMENT AND PLAN:  Active Problems:   Closed right hip fracture (HCC) #1 intertrochanteric right hip fracture, patient has moderate risk for perioperative complications. Continue pain management, ortho surgery is consulted, s/p ORIF 08/27/16 by Dr. Rudene Christians, continue  teds and SCDs  #2. Hypokalemia, supplemented intravenously, orally, resolved, follow in the morning  #3. Hypomagnesemia, supplemented intravenously, follow magnesium intermittently  #4. Elevated white cell count, likely due to fracture, no obvious infection, improved white blood cell count today, patient is not on antibiotic therapy  #5. Malignant essential hypertension, likely due to stress and pain, improved with medications , continue current therapy and discontinue IV fluids if needed #6. Dysphagia, supportive therapy, dysphagia diet, SLP is  appreciated  #7 elevated TSH,free T4 was also noted to be high, likely sick euthyroid, follow TSH tomorrow morning #8. Acute posthemorrhagic anemia with hemoglobin level dropped by 3 g since 24 hours ago, follow postoperatively, transfuse patient as needed   Management plans discussed with the patient, family and they are in agreement.   DRUG ALLERGIES: No Known Allergies  CODE STATUS:     Code Status  Orders        Start     Ordered   08/26/16 1129  Do  not attempt resuscitation (DNR)  Continuous    Question Answer Comment  In the event of cardiac or respiratory ARREST Do not call a "code blue"   In the event of cardiac or respiratory ARREST Do not perform Intubation, CPR, defibrillation or ACLS   In the event of cardiac or respiratory ARREST Use medication by any route, position, wound care, and other measures to relive pain and suffering. May use oxygen, suction and manual treatment of airway obstruction as needed for comfort.      08/26/16 1128    Code Status History    Date Active Date Inactive Code Status Order ID Comments User Context   08/26/2016  3:04 AM 08/26/2016 11:28 AM Full Code [id number]  Harrie Foreman, MD ED   01/26/2016  2:37 PM 01/27/2016  8:13 PM Full Code [id number]  Isaias Cowman, MD Inpatient    Advance Directive Documentation   Flowsheet Row Most Recent Value  Type of Advance Directive  Healthcare Power of Attorney  Pre-existing out of facility DNR order (yellow form or pink MOST form)  No data  "MOST" Form in Place?  No data      TOTAL TIME TAKING CARE OF THIS PATIENT: 35 minutes.    Theodoro Grist M.D on 08/27/2016 at 2:29 PM  Between 7am to 6pm - Pager - [phone]  After 6pm go to [email or web address] - password [email or web address] [facility]  [facility]  Office  [phone]  CC: Primary care physician; [place] Pink, MD

## 2016-08-27 NOTE — Anesthesia Postprocedure Evaluation (Signed)
Anesthesia Post Note  Patient: PEARSE KLEIBER  Procedure(s) Performed: Procedure(s) (LRB): INTRAMEDULLARY (IM) NAIL INTERTROCHANTRIC (Right)  Patient location during evaluation: PACU Anesthesia Type: General Level of consciousness: awake and alert Pain management: pain level controlled Vital Signs Assessment: post-procedure vital signs reviewed and stable Respiratory status: spontaneous breathing, nonlabored ventilation, respiratory function stable and patient connected to nasal cannula oxygen Cardiovascular status: blood pressure returned to baseline and stable Postop Assessment: no signs of nausea or vomiting Anesthetic complications: no    Last Vitals:  Vitals:   08/27/16 1359 08/27/16 1408  BP:  (!) 160/73  Pulse: 61 63  Resp: 20 18  Temp:  36.9 C    Last Pain:  Vitals:   08/27/16 1408  TempSrc:   PainSc: Asleep                 Martha Clan

## 2016-08-27 NOTE — Op Note (Signed)
08/25/2016 - 08/27/2016  1:20 PM  PATIENT:  Steve Aguirre  82 y.o. male  PRE-OPERATIVE DIAGNOSIS:  RIGHT HIP FRACTURE intertrochanteric, unstable  POST-OPERATIVE DIAGNOSIS:  RIGHT HIP FRACTURE same  PROCEDURE:  Procedure(s): INTRAMEDULLARY (IM) NAIL INTERTROCHANTRIC (Right)  SURGEON: Laurene Footman, MD  ASSISTANTS: None  ANESTHESIA:   general  EBL:  Total I/O In: 200 [I.V.:200] Out: 150 [Blood:150]  BLOOD ADMINISTERED:none  DRAINS: none   LOCAL MEDICATIONS USED:  MARCAINE     SPECIMEN:  No Specimen  DISPOSITION OF SPECIMEN:  N/A  COUNTS:  YES  TOURNIQUET:  * No tourniquets in log *  IMPLANTS: Biomet affixes 9 x 3 40 right rod with 100 mm lag screw and 42 mm distal interlocking screw  DICTATION: .Dragon Dictation patient was brought to the operating room and after adequate general anesthesia was obtained the patient was placed on the fracture table with the left leg in the well-leg holder traction was applied to the right foot and traction applied. Anatomic alignment was obtained. The hip was then prepped and draped using a Barrier drape method after timeout procedure been completed and local anesthetic infiltrated in the area of the planned incisions with half percent Sensorcaine with epinephrine total 30 cc to try to minimize bleeding area after prepping and draping the sterile fashion repeat timeout procedure and patient identification was carried out and a small incision was made proximally greater trochanter. Guidewires inserted in a center position on the lateral tip of the trochanter and proximal reaming carried out. The long guidewires inserted down the canal and measurement made to determine a 340 rod was appropriate. Reaming was carried out with 11 mm reamer and the rod was then inserted down the canal to the appropriate depth. A small lateral incision was made and the guide placed through the insertion handle of the Biomet affixes system and a guide were inserted into  the center center position of the head. This is measured drilled and 100 mm lag screw inserted. The proximal locking screw was then tightened with a quarter turn off to allow for sliding compression. The insertion handle was removed at this time and a permanent AP lateral images obtained with near anatomic alignment. Going distally with the leg slightly abducted single distal locking screws placed through the slotted hole using standard technique of making a small incision drilling measuring and placing the 5.0 cortical screw the wounds were thoroughly irrigated because of the patient having been ends are also bone wax was applied at the top of the femur to minimize bleeding if possible and deep fascia was then closed using a 0 Vicryl followed by infiltration of TXA deep to the fascia. 2-0 Vicryl was used on the incisions followed by staples Xeroform and honeycomb dressing is applied  PLAN OF CARE: Continue as inpatient  PATIENT DISPOSITION:  PACU - hemodynamically stable.

## 2016-08-27 NOTE — Anesthesia Preprocedure Evaluation (Signed)
Anesthesia Evaluation  Patient identified by MRN, date of birth, ID band Patient awake    Reviewed: Allergy & Precautions, H&P , NPO status , Patient's Chart, lab work & pertinent test results  History of Anesthesia Complications Negative for: history of anesthetic complications  Airway Mallampati: III  TM Distance: <3 FB Neck ROM: limited    Dental no notable dental hx. (+) Poor Dentition, Chipped   Pulmonary former smoker,    Pulmonary exam normal breath sounds clear to auscultation       Cardiovascular Exercise Tolerance: Poor hypertension, + CAD and + CABG  Normal cardiovascular exam+ pacemaker  Rhythm:regular Rate:Normal     Neuro/Psych PSYCHIATRIC DISORDERS negative neurological ROS     GI/Hepatic negative GI ROS, Neg liver ROS,   Endo/Other  negative endocrine ROS  Renal/GU      Musculoskeletal  (+) Arthritis ,   Abdominal   Peds  Hematology negative hematology ROS (+)   Anesthesia Other Findings Past Medical History: No date: Arthritis No date: Atrial fibrillation (HCC) No date: Cancer ([place])     Comment: prostate No date: Coronary artery disease No date: Hyperlipemia No date: Hypertension No date: Scarlet fever  Past Surgical History: No date: CARDIAC CATHETERIZATION No date: CATARACT EXTRACTION W/ INTRAOCULAR LENS  IMPLA* Bilateral No date: CORONARY ARTERY BYPASS GRAFT No date: JOINT REPLACEMENT     Comment: eft knee No date: NASAL SINUS SURGERY 01/26/2016: PACEMAKER INSERTION N/A     Comment: Procedure: INSERTION PACEMAKER;  Surgeon:               Isaias Cowman, MD;  Location: [facility] ORS;                Service: Cardiovascular;  Laterality: N/A; No date: PROSTATE SURGERY  BMI    Body Mass Index:  22.26 kg/m      Reproductive/Obstetrics negative OB ROS                             Anesthesia Physical Anesthesia Plan  ASA: III  Anesthesia Plan: General  ETT   Post-op Pain Management:    Induction:   Airway Management Planned:   Additional Equipment:   Intra-op Plan:   Post-operative Plan:   Informed Consent: I have reviewed the patients History and Physical, chart, labs and discussed the procedure including the risks, benefits and alternatives for the proposed anesthesia with the patient or authorized representative who has indicated his/her understanding and acceptance.     Plan Discussed with: Anesthesiologist, CRNA and Surgeon  Anesthesia Plan Comments: (Son in law consented over the phone.  Son in law informed that patient is higher risk for complications from anesthesia during this procedure due to patients medical history and age including but not limited to post operative cognitive dysfunction.  He voiced understanding.  )        Anesthesia Quick Evaluation

## 2016-08-27 NOTE — Care Management Important Message (Signed)
Important Message  Patient Details  Name: HUCKLEBERRY WICHERN MRN: [id number] Date of Birth: 09/18/1927   Medicare Important Message Given:  Yes    Katrina Stack, RN 08/27/2016, 2:39 PM

## 2016-08-27 NOTE — Anesthesia Postprocedure Evaluation (Deleted)
Anesthesia Post Note  Patient: Steve Aguirre  Procedure(s) Performed: Procedure(s) (LRB): INTRAMEDULLARY (IM) NAIL INTERTROCHANTRIC (Right)  Patient location during evaluation: PACU Anesthesia Type: General Level of consciousness: awake and alert Pain management: pain level controlled Vital Signs Assessment: post-procedure vital signs reviewed and stable Respiratory status: spontaneous breathing, nonlabored ventilation, respiratory function stable and patient connected to nasal cannula oxygen Cardiovascular status: blood pressure returned to baseline and stable Postop Assessment: no signs of nausea or vomiting Anesthetic complications: no    Last Vitals:  Vitals:   08/27/16 1359 08/27/16 1408  BP:  (!) 160/73  Pulse: 61 63  Resp: 20 18  Temp:  36.9 C    Last Pain:  Vitals:   08/27/16 1408  TempSrc:   PainSc: Asleep                 Precious Haws Piscitello

## 2016-08-27 NOTE — Care Management (Signed)
CSW  has performed bed search and anticipate transfer to [facility] 9/11.

## 2016-08-28 LAB — CBC
HEMATOCRIT: 26.9 % — AB (ref 40.0–52.0)
Hemoglobin: 9.2 g/dL — ABNORMAL LOW (ref 13.0–18.0)
MCH: 30.7 pg (ref 26.0–34.0)
MCHC: 34.4 g/dL (ref 32.0–36.0)
MCV: 89.4 fL (ref 80.0–100.0)
PLATELETS: 173 10*3/uL (ref 150–440)
RBC: 3.01 MIL/uL — AB (ref 4.40–5.90)
RDW: 14.8 % — ABNORMAL HIGH (ref 11.5–14.5)
WBC: 12.1 10*3/uL — ABNORMAL HIGH (ref 3.8–10.6)

## 2016-08-28 LAB — BASIC METABOLIC PANEL
Anion gap: 6 (ref 5–15)
BUN: 16 mg/dL (ref 6–20)
CO2: 26 mmol/L (ref 22–32)
Calcium: 8.1 mg/dL — ABNORMAL LOW (ref 8.9–10.3)
Chloride: 103 mmol/L (ref 101–111)
Creatinine, Ser: 0.75 mg/dL (ref 0.61–1.24)
GFR calc Af Amer: >60 mL/min (ref >60)
GLUCOSE: 141 mg/dL — AB (ref 65–99)
POTASSIUM: 4.2 mmol/L (ref 3.5–5.1)
Sodium: 135 mmol/L (ref 135–145)

## 2016-08-28 LAB — TSH: TSH: 1.819 u[IU]/mL (ref 0.350–4.500)

## 2016-08-28 MED ORDER — RIVAROXABAN 20 MG PO TABS: 20.0000 mg | ORAL_TABLET | Freq: Every day | ORAL | 0 refills | Status: DC

## 2016-08-28 MED ORDER — DIVALPROEX SODIUM 125 MG PO CSDR
250.0000 mg | DELAYED_RELEASE_CAPSULE | Freq: Two times a day (BID) | ORAL | Status: DC
  Administered 2016-08-28 – 2016-08-30 (×5): 250 mg via ORAL
  Filled 2016-08-28 (×5): qty 2

## 2016-08-28 MED ORDER — LEVOTHYROXINE SODIUM 50 MCG PO TABS
50.0000 ug | ORAL_TABLET | Freq: Every day | ORAL | Status: DC
  Administered 2016-08-29: 50 ug via ORAL
  Filled 2016-08-28 (×2): qty 1

## 2016-08-28 MED ORDER — HYDROCODONE-ACETAMINOPHEN 5-325 MG PO TABS: 1.0000 | ORAL_TABLET | Freq: Four times a day (QID) | ORAL | 0 refills | Status: DC | PRN

## 2016-08-28 NOTE — Progress Notes (Signed)
Pts BP 96/58 MD Sudini notified orders received to hold morning dose of Ramipril, ok to give sotalol.

## 2016-08-28 NOTE — Progress Notes (Signed)
Pt found with surgical dressing in hand, picking at wound site. Site bleeding. Honeycomb dressing reapplied. MD Poggi notified. No new orders received.

## 2016-08-28 NOTE — Progress Notes (Signed)
  Subjective: 1 Day Post-Op Procedure(s) (LRB): INTRAMEDULLARY (IM) NAIL INTERTROCHANTRIC (Right) Patient reports pain as moderate.   Patient seen in rounds with Dr. Roland Rack. Patient is well, and has had no acute complaints or problems Plan is to go Rehab after hospital stay. Negative for chest pain and shortness of breath Fever: no Gastrointestinal: Negative for nausea and vomiting  Objective: Vital signs in last 24 hours: Temp:  [97.6 F (36.4 C)-99.2 F (37.3 C)] 98.6 F (37 C) (09/09 0335) Pulse Rate:  [59-71] 69 (09/09 0335) Resp:  [15-22] 18 (09/09 0335) BP: (99-168)/(55-83) 111/62 (09/09 0335) SpO2:  [94 %-100 %] 97 % (09/09 0335) FiO2 (%):  [21 %] 21 % (09/08 1433)  Intake/Output from previous day:  Intake/Output Summary (Last 24 hours) at 08/28/16 0642 Last data filed at 08/28/16 [id number]  Gross per 24 hour  Intake             1100 ml  Output              885 ml  Net              215 ml    Intake/Output this shift: Total I/O In: 300 [IV Piggyback:300] Out: 400 [Urine:400]  Labs:  Recent Labs  08/26/16 0006 08/27/16 0331 08/28/16 0412  HGB 13.0 10.0* 9.2*    Recent Labs  08/27/16 0331 08/28/16 0412  WBC 10.9* 12.1*  RBC 3.25* 3.01*  HCT 28.9* 26.9*  PLT 199 173    Recent Labs  08/27/16 0331 08/28/16 0412  NA 138 135  K 4.9 4.2  CL 107 103  CO2 26 26  BUN 12 16  CREATININE 0.80 0.75  GLUCOSE 141* 141*  CALCIUM 8.4* 8.1*    Recent Labs  08/26/16 0006  INR 1.79     EXAM General - Patient is Confused and mildly combative Extremity - Dorsiflexion/Plantar flexion intact No cellulitis present Compartment soft Dressing/Incision - clean, dry, no drainage Motor Function - intact, moving foot and toes well on exam.   Past Medical History:  Diagnosis Date  . Arthritis   . Atrial fibrillation ([place])   . Cancer [facility])    prostate  . Coronary artery disease   . Hyperlipemia   . Hypertension   . Scarlet fever     Assessment/Plan: 1  Day Post-Op Procedure(s) (LRB): INTRAMEDULLARY (IM) NAIL INTERTROCHANTRIC (Right) Active Problems:   Closed right hip fracture (HCC)  Estimated body mass index is 22.26 kg/m as calculated from the following:   Height as of this encounter: 5\' 6"  (1.676 m).   Weight as of this encounter: 62.6 kg (137 lb 14.4 oz). Advance diet Up with therapy D/C IV fluids  DVT Prophylaxis - Xarelto Weight-Bearing as tolerated to right leg  Reche Dixon, PA-C Orthopaedic Surgery 08/28/2016, 6:42 AM

## 2016-08-28 NOTE — Progress Notes (Signed)
Report given to Tor Netters RN she is to resume care of pt. This evening.

## 2016-08-28 NOTE — Progress Notes (Signed)
[place] at [place] NAME: Steve Aguirre    MR#:  [id number]  DATE OF BIRTH:  15-Jul-1927  SUBJECTIVE:  CHIEF COMPLAINT:   Chief Complaint  Patient presents with  . Fall  . Hip Pain    right   Patient is from dementia unit at [facility]. Right hip fracture after fall. Status post surgery on 08/27/2016 Confused overnight.  Review of Systems  Unable to perform ROS: Dementia    VITAL SIGNS: Blood pressure (!) 96/58, pulse 70, temperature 98.1 F (36.7 C), temperature source Oral, resp. rate 18, height 5\' 6"  (1.676 m), weight 62.6 kg (137 lb 14.4 oz), SpO2 100 %.  PHYSICAL EXAMINATION:   GENERAL:  82 y.o.-year-old patient lying in the bed with no acute distress. Confused, but able to answer some questions and able to follow commands intermittently  EYES: Pupils equal, round, reactive to light and accommodation. No scleral icterus. Extraocular muscles intact.  HEENT: Head atraumatic, normocephalic. Oropharynx and nasopharynx clear.  NECK:  Supple, no jugular venous distention. No thyroid enlargement, no tenderness.  LUNGS: Normal breath sounds bilaterally, no wheezing, rales,rhonchi or crepitation. No use of accessory muscles of respiration.  CARDIOVASCULAR: S1, S2 , irregularly irregular. No murmurs, rubs, or gallops.  ABDOMEN: Soft, nontender, nondistended. Bowel sounds present. No organomegaly or mass.  EXTREMITIES: No pedal edema, cyanosis, or clubbing.  Dressing over right hip. No bleeding. NEUROLOGIC: Cranial nerves II through XII are intact. Muscle strength 5/5 in all extremities. Sensation intact. Gait not checked.  PSYCHIATRIC: The patient is alert and oriented x 3.  SKIN: No obvious rash, lesion, or ulcer.   ORDERS/RESULTS REVIEWED:   CBC  Recent Labs Lab 08/26/16 0006 08/27/16 0331 08/28/16 0412  WBC 15.4* 10.9* 12.1*  HGB 13.0 10.0* 9.2*  HCT 38.3* 28.9* 26.9*  PLT 263 199 173  MCV 89.6 89.0 89.4  MCH  30.3 30.9 30.7  MCHC 33.8 34.8 34.4  RDW 14.9* 15.0* 14.8*  LYMPHSABS 1.2  --   --   MONOABS 0.7  --   --   EOSABS 0.1  --   --   BASOSABS 0.1  --   --    ------------------------------------------------------------------------------------------------------------------  Chemistries   Recent Labs Lab 08/26/16 0006 08/26/16 1335 08/27/16 0331 08/28/16 0412  NA 138  --  138 135  K 2.8* 4.0 4.9 4.2  CL 99*  --  107 103  CO2 28  --  26 26  GLUCOSE 112*  --  141* 141*  BUN 14  --  12 16  CREATININE 0.86  --  0.80 0.75  CALCIUM 8.6*  --  8.4* 8.1*  MG 1.6*  --   --   --    ------------------------------------------------------------------------------------------------------------------ estimated creatinine clearance is 58.7 mL/min (by C-G formula based on SCr of 0.8 mg/dL). ------------------------------------------------------------------------------------------------------------------  Recent Labs  08/28/16 0412  TSH 1.819    Cardiac Enzymes  Recent Labs Lab 08/26/16 0006  TROPONINI <0.03   ------------------------------------------------------------------------------------------------------------------ Invalid input(s): POCBNP ---------------------------------------------------------------------------------------------------------------  RADIOLOGY: Dg Hip Operative Unilat W Or W/o Pelvis Right  Result Date: 08/27/2016 CLINICAL DATA:  ORIF right hip fracture EXAM: OPERATIVE RIGHT HIP (WITH PELVIS IF PERFORMED) 3 VIEWS TECHNIQUE: Fluoroscopic spot image(s) were submitted for interpretation post-operatively. COMPARISON:  None FLUOROSCOPY TIME:  1 minutes 6 second FINDINGS: Three intraoperative fluoroscopic spot images are provided. Interval placement of a right femoral intra medullary nail with a cannulated interlocking femoral neck screw and distal femoral cannulated interlocking fully threaded  screw. Mild osteoarthritis of the right hip. Normal alignment. IMPRESSION:  Interval ORIF right intertrochanteric fracture. Electronically Signed   By: Kathreen Devoid   On: 08/27/2016 14:37    EKG:  Orders placed or performed during the hospital encounter of 08/25/16  . EKG 12-Lead  . EKG 12-Lead    ASSESSMENT AND PLAN:  Active Problems:   Closed right hip fracture [facility])  # Intertrochanteric right hip fracture  Continue pain management, ortho surgery is consulted, s/p ORIF 08/27/16 by Dr. Rudene Christians SCDs  # Acute blood loss anemia status post surgery. Monitor. No need for transfusion.   # Hypokalemia And hypomagnesemia Replace.  # Malignant essential hypertension, likely due to stress and pain, improved with medications , continue current therapy and discontinue IV fluids if needed.  # Dysphagia, supportive therapy, dysphagia diet, SLP is  Appreciated.  # Dementia with inpatient delirium   Management plans discussed with the patient, family and they are in agreement.   DRUG ALLERGIES: No Known Allergies  CODE STATUS:     Code Status Orders        Start     Ordered   08/26/16 1129  Do not attempt resuscitation (DNR)  Continuous    Question Answer Comment  In the event of cardiac or respiratory ARREST Do not call a "code blue"   In the event of cardiac or respiratory ARREST Do not perform Intubation, CPR, defibrillation or ACLS   In the event of cardiac or respiratory ARREST Use medication by any route, position, wound care, and other measures to relive pain and suffering. May use oxygen, suction and manual treatment of airway obstruction as needed for comfort.      08/26/16 1128    Code Status History    Date Active Date Inactive Code Status Order ID Comments User Context   08/26/2016  3:04 AM 08/26/2016 11:28 AM Full Code [id number]  Harrie Foreman, MD ED   01/26/2016  2:37 PM 01/27/2016  8:13 PM Full Code [id number]  Isaias Cowman, MD Inpatient    Advance Directive Documentation   Flowsheet Row Most Recent Value  Type of Advance Directive   Healthcare Power of Attorney  Pre-existing out of facility DNR order (yellow form or pink MOST form)  No data  "MOST" Form in Place?  No data      TOTAL TIME TAKING CARE OF THIS PATIENT: 35 minutes.    Hillary Bow R M.D on 08/28/2016 at 11:07 AM  Between 7am to 6pm - Pager - [phone]  After 6pm go to [email or web address] - password [email or web address] [facility]  [place]  Office  [phone]  CC: Primary care physician; [place] Pink, MD

## 2016-08-28 NOTE — Evaluation (Signed)
Physical Therapy Evaluation Patient Details Name: THEORY IMIG MRN: [id number] DOB: Nov 04, 1927 Today's Date: 08/28/2016   History of Present Illness  82 y/o male here after R hip fracture and subsequent ORIF.  Pt confused and agitated  Clinical Impression  Limited eval secondary to pt confusion and agitation.  Pt was unwilling/unable to tell me his name, know why he is here (or where here is), etc and became very agitated with any attempts with PT doing exercises with his R LE.  PROM only, though pt became belligerent and attempted to hit PT with both his hand and swinging his ice pack.  Deferred mobility/attempts at sitting secondary to pt's clear unwillingness to participate.  Limited eval, hopefully pt will be more responsive with future attempts at PT.     Follow Up Recommendations SNF    Equipment Recommendations       Recommendations for Other Services       Precautions / Restrictions Precautions Precautions: Fall Restrictions Weight Bearing Restrictions: Yes RLE Weight Bearing: Weight bearing as tolerated      Mobility  Bed Mobility Overal bed mobility: Needs Assistance             General bed mobility comments: attempted to assist pt to EOB.  Pt highly agitated, unwilling to assist and ultimately screams and hits at PT and further attempts at sitting were deferred.  Transfers                    Ambulation/Gait                Stairs            Wheelchair Mobility    Modified Rankin (Stroke Patients Only)       Balance                                             Pertinent Vitals/Pain Pain Assessment:  (Pt very confused and agitated)    Home Living Family/patient expects to be discharged to:: Skilled nursing facility                      Prior Function Level of Independence:  (Pt is unable/unwilling to report)               Hand Dominance        Extremity/Trunk Assessment   Upper  Extremity Assessment: Difficult to assess due to impaired cognition (Pt has AROM in UEs enough to attempt hitting PT)           Lower Extremity Assessment: Difficult to assess due to impaired cognition (Pt tolerates R LE movement only minimally, pain/agitation)         Communication   Communication:  (agitated and confused)  Cognition   Behavior During Therapy: Agitated;Impulsive Overall Cognitive Status: Difficult to assess                      General Comments      Exercises General Exercises - Lower Extremity Ankle Circles/Pumps: PROM;5 reps Heel Slides: PROM;5 reps Hip ABduction/ADduction: PROM;5 reps      Assessment/Plan    PT Assessment Patient needs continued PT services  PT Diagnosis Difficulty walking;Generalized weakness   PT Problem List Decreased strength;Decreased activity tolerance;Decreased balance;Decreased mobility;Decreased knowledge of precautions;Decreased safety awareness;Decreased knowledge of use of DME;Pain;Decreased cognition;Decreased range of  motion  PT Treatment Interventions DME instruction;Gait training;Therapeutic activities;Functional mobility training;Therapeutic exercise;Balance training;Cognitive remediation;Patient/family education   PT Goals (Current goals can be found in the Care Plan section) Acute Rehab PT Goals Patient Stated Goal: Pt unable to state, very confused and agitated PT Goal Formulation: Patient unable to participate in goal setting Potential to Achieve Goals: Fair    Frequency 7X/week   Barriers to discharge        Co-evaluation               End of Session   Activity Tolerance: Treatment limited secondary to agitation Patient left: with bed alarm set;with call bell/phone within reach           Time: [id number] PT Time Calculation (min) (ACUTE ONLY): 15 min   Charges:   PT Evaluation $PT Eval Low Complexity: 1 Procedure     PT G CodesKreg Shropshire, DPT 08/28/2016, 10:33  AM

## 2016-08-28 NOTE — Evaluation (Signed)
Occupational Therapy Evaluation Patient Details Name: Steve Aguirre MRN: [id number] DOB: 05-22-1927 Today's Date: 08/28/2016    History of Present Illness 82 y/o male here after R hip fracture and subsequent ORIF.  Pt confused and alert/oriented to person only. Poor Historian.   Clinical Impression   Patient was supine in bed when OT arrived. Son-in-law was at bedside. Patient was pleasant, but impulsive. Alert and oriented to person only. Poor historian. Son-in-law able to provide PLOF. Patient has been cognitively declining over past several months. Faster rate of decline since this spring. Patient was placed in [facility] 2 months ago. Noted a decrease in appetite, however, patient was still able to self-feed. Needed assist with other ADL tasks. Patient has continued to have cognitive and physical decline over past 2 months, and started using a rolling walker to ambulate approximately 2 weeks ago. Son-in-Law noted that patient was taking very small shuffling steps prior to fall. Patient was able to sit edge of bed with max encouragement and hand over hand to assist with hand placement to assist. Max A for supine to sit with head of bed elevated. Patient unable to verbalize pain level, but indicated pain through grimace and expression with movement. Patient refused to attempt to stand, and was able to initiate putting BLE back onto bed to lie down. Attempted to assist in repositioning using trapezee with hands placed. Pt. could benefit from skilled OT services to improve functional mobility for ADLs and regaining Independence with self feeding.     Follow Up Recommendations  SNF    Equipment Recommendations       Recommendations for Other Services PT consult     Precautions / Restrictions Precautions Precautions: Fall Restrictions Weight Bearing Restrictions: Yes RLE Weight Bearing: Non weight bearing      Mobility Bed Mobility Overal bed mobility: Needs  Assistance Bed Mobility: Supine to Sit;Sit to Supine     Supine to sit: Max assist Sit to supine: Mod assist   General bed mobility comments:  (Supine to sit with HOB raised. Hand over hand for placement to assist with task. Able to initiate lifting BLE back into bed when performing sit to supine.)  Transfers                      Balance Overall balance assessment: History of Falls                                          ADL Overall ADL's : Needs assistance/impaired Eating/Feeding: Total assistance               Upper Body Dressing : Total assistance   Lower Body Dressing: Total assistance               Functional mobility during ADLs: Maximal assistance General ADL Comments:  (Patient needed max encouragement to sit edge of bed. Refused to attempt standing.)     Vision     Perception     Praxis      Pertinent Vitals/Pain Pain Assessment:  (Patient unable to communicate pain level, but grimace and reaching for Right leg when it is moved.)     Hand Dominance Right   Extremity/Trunk Assessment Upper Extremity Assessment Upper Extremity Assessment: Overall WFL for tasks assessed   Lower Extremity Assessment Lower Extremity Assessment: Defer to PT evaluation  Communication     Cognition Arousal/Alertness: Awake/alert Behavior During Therapy: Impulsive Overall Cognitive Status: History of cognitive impairments - at baseline       Memory: Decreased recall of precautions;Decreased short-term memory             General Comments       Exercises       Shoulder Instructions      Home Living Family/patient expects to be discharged to:: Skilled nursing facility                                        Prior Functioning/Environment Level of Independence: Needs assistance  Gait / Transfers Assistance Needed:  (Recently started using rolling walker 2 weeks ago) ADL's / Homemaking Assistance  Needed:  (Assist with dressing, toileting, and bathing. Was able to self-feed, but had decreasing appetite over past 2 months.)        OT Diagnosis: Generalized weakness;Cognitive deficits   OT Problem List: Impaired balance (sitting and/or standing);Decreased cognition;Decreased safety awareness   OT Treatment/Interventions: Self-care/ADL training;Therapeutic activities;Patient/family education    OT Goals(Current goals can be found in the care plan section) Acute Rehab OT Goals Patient Stated Goal: Pt unable to state. Son in law would like him to get stronger. OT Goal Formulation: With patient/family Time For Goal Achievement: 09/11/16 Potential to Achieve Goals: Fair  OT Frequency: Min 1X/week   Barriers to D/C: Decreased caregiver support          Co-evaluation              End of Session    Activity Tolerance: Patient tolerated treatment well Patient left: in bed;with call bell/phone within reach;with bed alarm set;with family/visitor present;with SCD's reapplied   Time: 1105-1135 OT Time Calculation (min): 30 min Charges:  OT General Charges $OT Visit: 1 Procedure OT Evaluation $OT Eval Low Complexity: 1 Procedure OT Treatments $Self Care/Home Management : 8-22 mins G-Codes:    Wilkin Lippy L 2016/09/20, 2:42 PM  Amie [place], OTR/L

## 2016-08-28 NOTE — Progress Notes (Signed)
Foley d/c'd at [employer]

## 2016-08-28 NOTE — Progress Notes (Signed)
Physical Therapy Treatment Patient Details Name: Steve Aguirre MRN: [id number] DOB: January 04, 1927 Today's Date: 08/28/2016    History of Present Illness 82 y/o male here after R hip fracture and subsequent ORIF.  Pt confused and alert/oriented to person only.    PT Comments    Pt continues to be upset with any R LE movement and attempts at sitting.  Pt highly agitated and though wife was present attempting to consol and encourage him he ultimately was agitated, belligerent, hitting, swearing and generally unable/unwilling to participate.  Pt does not understand why he is here and it is very difficult to motivate him to do much.  Follow Up Recommendations  SNF     Equipment Recommendations       Recommendations for Other Services       Precautions / Restrictions Precautions Precautions: Fall Restrictions Weight Bearing Restrictions: Yes RLE Weight Bearing: Weight bearing as tolerated    Mobility  Bed Mobility Overal bed mobility: Needs Assistance Bed Mobility: Supine to Sit;Sit to Supine     Supine to sit: Max assist Sit to supine: Max assist   General bed mobility comments: Pt against the idea of doing any movement, wife there encouraging with little change in attitude.  Pt did maintain sitting balance with w/o assist and even swinging UEs to attempt hitting PT and maintaining balance.    Transfers                    Ambulation/Gait                 Stairs            Wheelchair Mobility    Modified Rankin (Stroke Patients Only)       Balance Overall balance assessment: History of Falls                                  Cognition Arousal/Alertness:  (very confused (pleasantly at first) quickly changes w/ mvt) Behavior During Therapy: Agitated (highly agitated, again punching at PT, swearing consistently) Overall Cognitive Status: History of cognitive impairments - at baseline       Memory: Decreased recall of  precautions;Decreased short-term memory              Exercises General Exercises - Lower Extremity Ankle Circles/Pumps: PROM;5 reps Heel Slides: 10 reps;PROM Hip ABduction/ADduction: PROM;10 reps Straight Leg Raises: PROM;5 reps    General Comments        Pertinent Vitals/Pain Pain Assessment:  (unable to rate, clearly uncomfortable with any L RE mvt)    Home Living Family/patient expects to be discharged to:: Skilled nursing facility                    Prior Function Level of Independence: Needs assistance  Gait / Transfers Assistance Needed:  (Recently started using rolling walker 2 weeks ago) ADL's / Homemaking Assistance Needed:  (Assist with dressing, toileting, and bathing. Was able to self-feed, but had decreasing appetite over past 2 months.)     PT Goals (current goals can now be found in the care plan section) Acute Rehab PT Goals Patient Stated Goal: Pt unable to state. Son in law would like him to get stronger.    Frequency       PT Plan Current plan remains appropriate    Co-evaluation             End  of Session   Activity Tolerance: Treatment limited secondary to agitation Patient left: with bed alarm set;with call bell/phone within reach;with family/visitor present     Time: [id number] PT Time Calculation (min) (ACUTE ONLY): 26 min  Charges:  $Therapeutic Exercise: 8-22 mins $Therapeutic Activity: 8-22 mins                    G Codes:      Kreg Shropshire, DPT 08/28/2016, 4:20 PM

## 2016-08-29 LAB — CBC WITH DIFFERENTIAL/PLATELET
Basophils Absolute: 0 10*3/uL (ref 0–0.1)
Basophils Relative: 0 %
EOS ABS: 0.1 10*3/uL (ref 0–0.7)
Eosinophils Relative: 1 %
HCT: 26 % — ABNORMAL LOW (ref 40.0–52.0)
HEMOGLOBIN: 9 g/dL — AB (ref 13.0–18.0)
LYMPHS ABS: 1.6 10*3/uL (ref 1.0–3.6)
Lymphocytes Relative: 15 %
MCH: 31.1 pg (ref 26.0–34.0)
MCHC: 34.7 g/dL (ref 32.0–36.0)
MCV: 89.4 fL (ref 80.0–100.0)
MONOS PCT: 8 %
Monocytes Absolute: 0.9 10*3/uL (ref 0.2–1.0)
NEUTROS PCT: 76 %
Neutro Abs: 8.4 10*3/uL — ABNORMAL HIGH (ref 1.4–6.5)
Platelets: 196 10*3/uL (ref 150–440)
RBC: 2.9 MIL/uL — ABNORMAL LOW (ref 4.40–5.90)
RDW: 14.8 % — ABNORMAL HIGH (ref 11.5–14.5)
WBC: 11 10*3/uL — ABNORMAL HIGH (ref 3.8–10.6)

## 2016-08-29 MED ORDER — ENSURE ENLIVE PO LIQD
237.0000 mL | Freq: Three times a day (TID) | ORAL | Status: DC
  Administered 2016-08-29: 237 mL via ORAL

## 2016-08-29 NOTE — Progress Notes (Signed)
Pt continues to swear at staff excessively anytime we attempt to move him or clean him up.

## 2016-08-29 NOTE — Progress Notes (Signed)
Patient is alert to self only. incont of urine. Dressing to right hip was changed this shift. Patient doesn't not like being moved. Swears at staff with movement. Takes meds crushed in applesauce. No BM this shift. Bed alarm on for safety. Slept most of the afternoon.

## 2016-08-29 NOTE — Progress Notes (Signed)
[facility] [place] NAME: Steve Aguirre    MR#:  [id number]  DATE OF BIRTH:  1927/01/20  SUBJECTIVE:  CHIEF COMPLAINT:   Chief Complaint  Patient presents with  . Fall  . Hip Pain    right   Patient is from dementia unit at [facility]. Right hip fracture after fall. Status post surgery on 08/27/2016 Confused on and off. Calm at this time  Review of Systems  Unable to perform ROS: Dementia    VITAL SIGNS: Blood pressure 114/67, pulse 68, temperature 98 F (36.7 C), temperature source Oral, resp. rate 18, height 5\' 6"  (1.676 m), weight 59.2 kg (130 lb 9.6 oz), SpO2 95 %.  PHYSICAL EXAMINATION:   GENERAL:  82 y.o.-year-old patient lying in the bed with no acute distress. Confused, but able to answer some questions and able to follow commands intermittently  EYES: Pupils equal, round, reactive to light and accommodation. No scleral icterus. Extraocular muscles intact.  HEENT: Head atraumatic, normocephalic. Oropharynx and nasopharynx clear.  NECK:  Supple, no jugular venous distention. No thyroid enlargement, no tenderness.  LUNGS: Normal breath sounds bilaterally, no wheezing, rales,rhonchi or crepitation. No use of accessory muscles of respiration.  CARDIOVASCULAR: S1, S2 , irregularly irregular. No murmurs, rubs, or gallops.  ABDOMEN: Soft, nontender, nondistended. Bowel sounds present. No organomegaly or mass.  EXTREMITIES: No pedal edema, cyanosis, or clubbing.  Dressing over right hip. No bleeding. NEUROLOGIC: Cranial nerves II through XII are intact. Muscle strength 5/5 in all extremities. Sensation intact. Gait not checked.  PSYCHIATRIC: The patient is alert and oriented x 3.  SKIN: No obvious rash, lesion, or ulcer.   ORDERS/RESULTS REVIEWED:   CBC  Recent Labs Lab 08/26/16 0006 08/27/16 0331 08/28/16 0412 08/29/16 0539  WBC 15.4* 10.9* 12.1* 11.0*  HGB 13.0 10.0* 9.2* 9.0*  HCT 38.3* 28.9* 26.9* 26.0*   PLT 263 199 173 196  MCV 89.6 89.0 89.4 89.4  MCH 30.3 30.9 30.7 31.1  MCHC 33.8 34.8 34.4 34.7  RDW 14.9* 15.0* 14.8* 14.8*  LYMPHSABS 1.2  --   --  1.6  MONOABS 0.7  --   --  0.9  EOSABS 0.1  --   --  0.1  BASOSABS 0.1  --   --  0.0   ------------------------------------------------------------------------------------------------------------------  Chemistries   Recent Labs Lab 08/26/16 0006 08/26/16 1335 08/27/16 0331 08/28/16 0412  NA 138  --  138 135  K 2.8* 4.0 4.9 4.2  CL 99*  --  107 103  CO2 28  --  26 26  GLUCOSE 112*  --  141* 141*  BUN 14  --  12 16  CREATININE 0.86  --  0.80 0.75  CALCIUM 8.6*  --  8.4* 8.1*  MG 1.6*  --   --   --    ------------------------------------------------------------------------------------------------------------------ estimated creatinine clearance is 55.5 mL/min (by C-G formula based on SCr of 0.8 mg/dL). ------------------------------------------------------------------------------------------------------------------  Recent Labs  08/28/16 0412  TSH 1.819    Cardiac Enzymes  Recent Labs Lab 08/26/16 0006  TROPONINI <0.03   ------------------------------------------------------------------------------------------------------------------ Invalid input(s): POCBNP ---------------------------------------------------------------------------------------------------------------  RADIOLOGY: Dg Hip Operative Unilat W Or W/o Pelvis Right  Result Date: 08/27/2016 CLINICAL DATA:  ORIF right hip fracture EXAM: OPERATIVE RIGHT HIP (WITH PELVIS IF PERFORMED) 3 VIEWS TECHNIQUE: Fluoroscopic spot image(s) were submitted for interpretation post-operatively. COMPARISON:  None FLUOROSCOPY TIME:  1 minutes 6 second FINDINGS: Three intraoperative fluoroscopic spot images are provided. Interval placement of a  right femoral intra medullary nail with a cannulated interlocking femoral neck screw and distal femoral cannulated interlocking fully  threaded screw. Mild osteoarthritis of the right hip. Normal alignment. IMPRESSION: Interval ORIF right intertrochanteric fracture. Electronically Signed   By: Kathreen Devoid   On: 08/27/2016 14:37    EKG:  Orders placed or performed during the hospital encounter of 08/25/16  . EKG 12-Lead  . EKG 12-Lead    ASSESSMENT AND PLAN:  Active Problems:   Closed right hip fracture [facility])  # Intertrochanteric right hip fracture  Continue pain management s/p ORIF 08/27/16 by Dr. Rudene Christians SCDs PT working with patient D/C to SNF tomorrow  # Acute blood loss anemia status post surgery. Monitor. No need for transfusion.   # Hypokalemia And hypomagnesemia Replace.  # Afib Placed back on Xarelto  # essential hypertension Continue meds  # Dysphagia, supportive therapy, dysphagia diet, SLP is  Appreciated.  # Dementia with inpatient delirium   Management plans discussed with the patient, family and they are in agreement.   DRUG ALLERGIES: No Known Allergies  CODE STATUS:     Code Status Orders        Start     Ordered   08/26/16 1129  Do not attempt resuscitation (DNR)  Continuous    Question Answer Comment  In the event of cardiac or respiratory ARREST Do not call a "code blue"   In the event of cardiac or respiratory ARREST Do not perform Intubation, CPR, defibrillation or ACLS   In the event of cardiac or respiratory ARREST Use medication by any route, position, wound care, and other measures to relive pain and suffering. May use oxygen, suction and manual treatment of airway obstruction as needed for comfort.      08/26/16 1128    Code Status History    Date Active Date Inactive Code Status Order ID Comments User Context   08/26/2016  3:04 AM 08/26/2016 11:28 AM Full Code [id number]  Harrie Foreman, MD ED   01/26/2016  2:37 PM 01/27/2016  8:13 PM Full Code [id number]  Isaias Cowman, MD Inpatient    Advance Directive Documentation   Flowsheet Row Most Recent Value  Type of  Advance Directive  Healthcare Power of Attorney  Pre-existing out of facility DNR order (yellow form or pink MOST form)  No data  "MOST" Form in Place?  No data     TOTAL TIME TAKING CARE OF THIS PATIENT: 35 minutes.   Hillary Bow R M.D on 08/29/2016 at 11:48 AM  Between 7am to 6pm - Pager - [phone]  After 6pm go to [email or web address] - password [email or web address]  [facility]  Office  [phone]  CC: Primary care physician; [place] Pink, MD

## 2016-08-29 NOTE — Progress Notes (Signed)
  Subjective: 2 Days Post-Op Procedure(s) (LRB): INTRAMEDULLARY (IM) NAIL INTERTROCHANTRIC (Right) Patient reports pain as moderate.   Patient seen in rounds with Dr. Roland Rack. Patient is well, and has had no acute complaints or problems.   the patient is more alert today. Plan is to go Rehab after hospital stay. Negative for chest pain and shortness of breath Fever: no Gastrointestinal: Negative for nausea and vomiting  Objective: Vital signs in last 24 hours: Temp:  [97.8 F (36.6 C)-98.1 F (36.7 C)] 97.8 F (36.6 C) (09/10 0400) Pulse Rate:  [64-82] 67 (09/10 0400) Resp:  [18] 18 (09/10 0400) BP: (96-137)/(58-78) 108/61 (09/10 0400) SpO2:  [97 %-100 %] 97 % (09/10 0400) Weight:  [59.2 kg (130 lb 9.6 oz)] 59.2 kg (130 lb 9.6 oz) (09/10 0402)  Intake/Output from previous day:  Intake/Output Summary (Last 24 hours) at 08/29/16 0629 Last data filed at 08/29/16 0400  Gross per 24 hour  Intake                0 ml  Output                0 ml  Net                0 ml    Intake/Output this shift: No intake/output data recorded.  Labs:  Recent Labs  08/27/16 0331 08/28/16 0412  HGB 10.0* 9.2*    Recent Labs  08/27/16 0331 08/28/16 0412  WBC 10.9* 12.1*  RBC 3.25* 3.01*  HCT 28.9* 26.9*  PLT 199 173    Recent Labs  08/27/16 0331 08/28/16 0412  NA 138 135  K 4.9 4.2  CL 107 103  CO2 26 26  BUN 12 16  CREATININE 0.80 0.75  GLUCOSE 141* 141*  CALCIUM 8.4* 8.1*   No results for input(s): LABPT, INR in the last 72 hours.   EXAM General - Patient is Confused and mildly combative Extremity - Dorsiflexion/Plantar flexion intact No cellulitis present Compartment soft Dressing/Incision - Scant blood tinged drainage, dry, minimal drainage Motor Function - intact, moving foot and toes well on exam.   Past Medical History:  Diagnosis Date  . Arthritis   . Atrial fibrillation ([place])   . Cancer [facility])    prostate  . Coronary artery disease   . Hyperlipemia    . Hypertension   . Scarlet fever     Assessment/Plan: 2 Days Post-Op Procedure(s) (LRB): INTRAMEDULLARY (IM) NAIL INTERTROCHANTRIC (Right) Active Problems:   Closed right hip fracture (HCC)  Estimated body mass index is 21.08 kg/m as calculated from the following:   Height as of this encounter: 5\' 6"  (1.676 m).   Weight as of this encounter: 59.2 kg (130 lb 9.6 oz). Advance diet Up with therapy D/C IV fluids  Discharge to rehabilitation when cleared by medicine.  DVT Prophylaxis - Xarelto Weight-Bearing as tolerated to right leg  Reche Dixon, PA-C Orthopaedic Surgery 08/29/2016, 6:29 AM

## 2016-08-29 NOTE — Progress Notes (Signed)
Physical Therapy Treatment Patient Details Name: Steve Aguirre MRN: [id number] DOB: 07-31-1927 Today's Date: 08/29/2016    History of Present Illness 82 y/o male here after R hip fracture and subsequent ORIF.  Pt confused and alert/oriented to person only. Poor Historian.    PT Comments    Pt continues to be very confused and struggles to do a lot of meaningful participation but was much less agitated and more compliant of PROM exercises.  He was able to tolerate some sitting at EOB and though he was unable to get to full standing on 2 attempts with walker he did not excessively resist or become belligerent.  Pt continued to have pain with any PROM on R LE, but it seemed less severe and better managed today.  Follow Up Recommendations  SNF     Equipment Recommendations       Recommendations for Other Services       Precautions / Restrictions Precautions Precautions: Fall Restrictions Weight Bearing Restrictions: Yes RLE Weight Bearing: Weight bearing as tolerated    Mobility  Bed Mobility Overal bed mobility: Needs Assistance Bed Mobility: Supine to Sit;Sit to Supine     Supine to sit: Max assist Sit to supine: Max assist   General bed mobility comments: Pt did not show any assist but did not resist as much as previous attempts  Transfers Overall transfer level: Needs assistance Equipment used: Rolling walker (2 wheeled) Transfers: Sit to/from Stand Sit to Stand: +2 physical assistance;Max assist         General transfer comment: 2 standing attempts.  Pt unable to get to fully upright on either attempt, but ultimately he was not has resistive or belligerent as previous attempts   Ambulation/Gait                 Stairs            Wheelchair Mobility    Modified Rankin (Stroke Patients Only)       Balance Overall balance assessment: Needs assistance   Sitting balance-Leahy Scale: Fair Sitting balance - Comments: Pt initially leaning back  but with assist to position and cuing is able to stay seated w/o direct assist for a few brief episodes     Standing balance-Leahy Scale: Zero Standing balance comment: Pt unable to get to truly upright even with heavy assist and cuing                    Cognition Arousal/Alertness: Lethargic (highly confused) Behavior During Therapy: Agitated (much less so than previous PT sessions) Overall Cognitive Status: History of cognitive impairments - at baseline                      Exercises General Exercises - Lower Extremity Ankle Circles/Pumps: PROM;15 reps Quad Sets: Strengthening;10 reps Gluteal Sets: Strengthening;15 reps Short Arc Quad: PROM;15 reps Heel Slides: PROM;15 reps Hip ABduction/ADduction: PROM;15 reps Straight Leg Raises: PROM;5 reps    General Comments        Pertinent Vitals/Pain Pain Assessment:  (unable to rate, indicates less pain than previous sessions)    Home Living                      Prior Function            PT Goals (current goals can now be found in the care plan section) Progress towards PT goals: Progressing toward goals    Frequency  7X/week  PT Plan Current plan remains appropriate    Co-evaluation             End of Session Equipment Utilized During Treatment: Gait belt Activity Tolerance:  (not nearly as agitated as typical, still limited participati) Patient left: with bed alarm set;with call bell/phone within reach;with family/visitor present     Time: [id number] PT Time Calculation (min) (ACUTE ONLY): 26 min  Charges:  $Therapeutic Exercise: 8-22 mins $Therapeutic Activity: 8-22 mins                    G Codes:      Kreg Shropshire, DPT 08/29/2016, 11:48 AM

## 2016-08-29 NOTE — Progress Notes (Signed)
Initial Nutrition Assessment  DOCUMENTATION CODES:   Not applicable  INTERVENTION:  Ensure Enlive po TID, each supplement provides 350 kcal and 20 grams of protein Recommend SLP eval - pt's wife endorses pt constantly "coughing up crap and fluid."  NUTRITION DIAGNOSIS:   Inadequate oral intake related to poor appetite, chronic illness, dysphagia as evidenced by per patient/family report.  GOAL:   Patient will meet greater than or equal to 90% of their needs  MONITOR:   PO intake, Labs, I & O's, Skin  REASON FOR ASSESSMENT:   Malnutrition Screening Tool    ASSESSMENT:   The patient with past medical history of dementia, atrial fibrillation and hypertension presents to the emergency department after suffering a mechanical fall. The patient reportedly complained of pain in his right hip area. X-ray of the hip in the emergency department revealed a mildly displaced intertrochanteric fracture of the femoral neck  Spoke with Pt's wife at bedside. She endorses pt losing approximately 20# over the past 2 months. Per chart review he exhibits a 22#/14% severe wt loss over 2.5 months. She states he did not eat much at [facility], continues here, but she also states he "coughs up crap and fluid constantly." Question pt's level of dysphagia and possible aspiration. Appears SLP was to see pt earlier this week but was unable to complete eval. Unable to complete Nutrition-Focused physical exam at this time.  She states he was doing Ensure three times a day at [place]. Labs and medications reviewed.  Diet Order:  DIET DYS 2 Room service appropriate? No; Fluid consistency: Thin  Skin:  Reviewed, no issues  Last BM:  PTA  Height:   Ht Readings from Last 1 Encounters:  08/26/16 5\' 6"  (1.676 m)    Weight:   Wt Readings from Last 1 Encounters:  08/29/16 130 lb 9.6 oz (59.2 kg)    Ideal Body Weight:  59.09 kg  BMI:  Body mass index is 21.08 kg/m.  Estimated Nutritional Needs:    Kcal:  1500-1800 calories  Protein:  60-71 gm  Fluid:  >/= 1.5L  EDUCATION NEEDS:   No education needs identified at this time  Satira Anis. Rochelle Nephew, MS, RD LDN Inpatient Clinical Dietitian Pager [phone]

## 2016-08-30 ENCOUNTER — Encounter: Payer: Self-pay | Admitting: Orthopedic Surgery

## 2016-08-30 NOTE — Progress Notes (Signed)
Pt refused his 6 am medication, says he does not like anything that we crush it up in.

## 2016-08-30 NOTE — Progress Notes (Signed)
Report called to [place].

## 2016-08-30 NOTE — Discharge Instructions (Signed)
INSTRUCTIONS AFTER Surgery  o Remove items at home which could result in a fall. This includes throw rugs or furniture in walking pathways o ICE to the affected joint every three hours while awake for 30 minutes at a time, for at least the first 3-5 days, and then as needed for pain and swelling.  Continue to use ice for pain and swelling. You may notice swelling that will progress down to the foot and ankle.  This is normal after surgery.  Elevate your leg when you are not up walking on it.   o Continue to use the breathing machine you got in the hospital (incentive spirometer) which will help keep your temperature down.  It is common for your temperature to cycle up and down following surgery, especially at night when you are not up moving around and exerting yourself.  The breathing machine keeps your lungs expanded and your temperature down.   DIET:  As you were doing prior to hospitalization, we recommend a well-balanced diet.  DRESSING / WOUND CARE / SHOWERING  Dressings can be changed on an as-needed basis. Evaluate the wound for signs of infection. No showering at this time until staples are removed. The staples will be removed at the [place] clinic in 2 weeks.  ACTIVITY  o Increase activity slowly as tolerated, but follow the weight bearing instructions below.   o No driving for 6 weeks or until further direction given by your physician.  You cannot drive while taking narcotics.  o No lifting or carrying greater than 10 lbs. until further directed by your surgeon. o Avoid periods of inactivity such as sitting longer than an hour when not asleep. This helps prevent blood clots.  o You may return to work once you are authorized by your doctor.     WEIGHT BEARING  Weight-bearing as tolerated   EXERCISES Range of motion and gait training with strengthening involving his right lower extremity.  CONSTIPATION  Constipation is defined medically as fewer than three stools per week  and severe constipation as less than one stool per week.  Even if you have a regular bowel pattern at home, your normal regimen is likely to be disrupted due to multiple reasons following surgery.  Combination of anesthesia, postoperative narcotics, change in appetite and fluid intake all can affect your bowels.   YOU MUST use at least one of the following options; they are listed in order of increasing strength to get the job done.  They are all available over the counter, and you may need to use some, POSSIBLY even all of these options:    Drink plenty of fluids (prune juice may be helpful) and high fiber foods Colace 100 mg by mouth twice a day  Senokot for constipation as directed and as needed Dulcolax (bisacodyl), take with full glass of water  Miralax (polyethylene glycol) once or twice a day as needed.  If you have tried all these things and are unable to have a bowel movement in the first 3-4 days after surgery call either your surgeon or your primary doctor.    If you experience loose stools or diarrhea, hold the medications until you stool forms back up.  If your symptoms do not get better within 1 week or if they get worse, check with your doctor.  If you experience "the worst abdominal pain ever" or develop nausea or vomiting, please contact the office immediately for further recommendations for treatment.   ITCHING:  If you experience itching  with your medications, try taking only a single pain pill, or even half a pain pill at a time.  You can also use Benadryl over the counter for itching or also to help with sleep.   TED HOSE STOCKINGS:  Use stockings on both legs until for at least 2 weeks or as directed by physician office. They may be removed at night for sleeping.  MEDICATIONS:  See your medication summary on the After Visit Summary that nursing will review with you.  You may have some home medications which will be placed on hold until you complete the course of blood  thinner medication.  It is important for you to complete the blood thinner medication as prescribed.  PRECAUTIONS:  If you experience chest pain or shortness of breath - call 911 immediately for transfer to the hospital emergency department.   If you develop a fever greater that 101 F, purulent drainage from wound, increased redness or drainage from wound, foul odor from the wound/dressing, or calf pain - CONTACT YOUR SURGEON.                                                   FOLLOW-UP APPOINTMENTS:  If you do not already have a post-op appointment, please call the office for an appointment to be seen by your surgeon.  Guidelines for how soon to be seen are listed in your After Visit Summary, but are typically between 1-4 weeks after surgery.  OTHER INSTRUCTIONS:     MAKE SURE YOU:   Understand these instructions.   Get help right away if you are not doing well or get worse.    Thank you for letting us be a part of your medical care team.  It is a privilege we respect greatly.  We hope these instructions will help you stay on track for a fast and full recovery!    DYSPHAGIA 2 DIET with THIN LIQUIDS

## 2016-08-30 NOTE — Progress Notes (Signed)
Subjective: Appears comfortable unless leg is moved, very confused and combative with any attempts to move him.   Objective: Vital signs in last 24 hours: Temp:  [98 F (36.7 C)-98.5 F (36.9 C)] 98.4 F (36.9 C) (09/11 0409) Pulse Rate:  [62-68] 66 (09/11 0409) Resp:  [18] 18 (09/11 0409) BP: (114-146)/(59-73) 146/73 (09/11 0409) SpO2:  [95 %-100 %] 99 % (09/11 0409) Weight:  [58.6 kg (129 lb 1.6 oz)] 58.6 kg (129 lb 1.6 oz) (09/11 0409)  Intake/Output from previous day: No intake/output data recorded. Intake/Output this shift: No intake/output data recorded.   Recent Labs  08/28/16 0412 08/29/16 0539  HGB 9.2* 9.0*    Recent Labs  08/28/16 0412 08/29/16 0539  WBC 12.1* 11.0*  RBC 3.01* 2.90*  HCT 26.9* 26.0*  PLT 173 196    Recent Labs  08/28/16 0412  NA 135  K 4.2  CL 103  CO2 26  BUN 16  CREATININE 0.75  GLUCOSE 141*  CALCIUM 8.1*   No results for input(s): LABPT, INR in the last 72 hours.  Incision: dressing C/D/I and scant drainage  Assessment/Plan: Stable post ORIF right hip Possible transfer today, Follow up with me in 10 days   Denny Lave 08/30/2016, 8:11 AM

## 2016-08-30 NOTE — Care Management Important Message (Signed)
Important Message  Patient Details  Name: Steve Aguirre MRN: [id number] Date of Birth: 18-Aug-1927   Medicare Important Message Given:  Yes    Jolly Mango, RN 08/30/2016, 9:00 AM

## 2016-08-30 NOTE — Progress Notes (Signed)
Patient is medically stable for D/C to [facility] today. Per Seth Bake admissions coordinator at [facility] patient will go to room 310. RN will call report at [phone] and arrange EMS for transport. Clinical Education officer, museum (CSW) sent D/C orders to [facility] via [employer]. CSW contacted patient's wife [name] and made her aware of above. Please reconsult if future social work needs arise. CSW signing off.   [employer], LCSW [phone]

## 2016-08-30 NOTE — Clinical Social Work Placement (Signed)
   CLINICAL SOCIAL WORK PLACEMENT  NOTE  Date:  08/30/2016  Patient Details  Name: Steve Aguirre MRN: [id number] Date of Birth: 10/07/1927  Clinical Social Work is seeking post-discharge placement for this patient at the [place] level of care (*CSW will initial, date and re-position this form in  chart as items are completed):  Yes   Patient/family provided with [place] Work Department's list of facilities offering this level of care within the geographic area requested by the patient (or if unable, by the patient's family).  Yes   Patient/family informed of their freedom to choose among providers that offer the needed level of care, that participate in Medicare, Medicaid or managed care program needed by the patient, have an available bed and are willing to accept the patient.  Yes   Patient/family informed of [place]'s ownership interest in [facility] and [facility], as well as of the fact that they are under no obligation to receive care at these facilities.  PASRR submitted to EDS on       PASRR number received on       Existing PASRR number confirmed on 08/26/16     FL2 transmitted to all facilities in geographic area requested by pt/family on 08/26/16     FL2 transmitted to all facilities within larger geographic area on       Patient informed that his/her managed care company has contracts with or will negotiate with certain facilities, including the following:        Yes   Patient/family informed of bed offers received.  Patient chooses bed at  [facility] )     Physician recommends and patient chooses bed at      Patient to be [facility] to  [facility] ) on 08/30/16.  Patient to be transferred to facility by  [facility] )     Patient family notified on 08/30/16 of transfer.  Name of family member notified:   (Patient's wife [name] is aware of D/C today. )     PHYSICIAN       Additional Comment:     _______________________________________________ Gabriela Irigoyen, Veronia Beets, LCSW 08/30/2016, 11:49 AM

## 2016-08-30 NOTE — Discharge Summary (Signed)
[facility] [place] NAME: Steve Aguirre    MR#:  [id number]  DATE OF BIRTH:  08/01/1927  DATE OF ADMISSION:  08/25/2016 ADMITTING PHYSICIAN: Harrie Foreman, MD  DATE OF DISCHARGE: 08/30/2016  PRIMARY CARE PHYSICIAN: Maryland Pink, MD   ADMISSION DIAGNOSIS:  Hypokalemia [E87.6] Chronic atrial fibrillation ([employer]) [I48.2] Right hip pain [M25.551] Alzheimer's dementia [G30.9, F02.80] Fall, initial encounter [W19.XXXA] Hip fracture, right, closed, initial encounter ([place]) [S72.001A]  DISCHARGE DIAGNOSIS:  Active Problems:   Closed right hip fracture ([place])   SECONDARY DIAGNOSIS:   Past Medical History:  Diagnosis Date  . Arthritis   . Atrial fibrillation ([place])   . Cancer [facility])    prostate  . Coronary artery disease   . Hyperlipemia   . Hypertension   . Scarlet fever      ADMITTING HISTORY  Chief Complaint: Fall HPI: The patient with past medical history of dementia, atrial fibrillation and hypertension presents to the emergency department after suffering a mechanical fall. The patient reportedly complained of pain in his right hip area. X-ray of the hip in the emergency department revealed a mildly displaced intertrochanteric fracture of the femoral neck. The patient surgery was consulted who recommended admission to the hospitalist service prior to surgical consult.  HOSPITAL COURSE:   # Intertrochanteric right hip fracture  Continue pain management s/p ORIF 08/27/16 by Dr. Rudene Christians PT working with patient D/C to SNF today Orthopedic f/u as OP  # Acute blood loss anemia status post surgery. Monitor. No need for transfusion.   # Hypokalemia And hypomagnesemia Replaced  # Afib Placed back on Xarelto  # Essential hypertension Continue meds.  # Dysphagia, due to dementia Dysphagia 2 diet with thin liquids  # Dementia with inpatient delirium - stable  Discharge to SNF today  CONSULTS OBTAINED:  Treatment  Team:  Hessie Knows, MD  DRUG ALLERGIES:  No Known Allergies  DISCHARGE MEDICATIONS:   Current Discharge Medication List    START taking these medications   Details  HYDROcodone-acetaminophen (NORCO/VICODIN) 5-325 MG tablet Take 1-2 tablets by mouth every 6 (six) hours as needed for moderate pain. Qty: 30 tablet, Refills: 0      CONTINUE these medications which have CHANGED   Details  rivaroxaban (XARELTO) 20 MG TABS tablet Take 1 tablet (20 mg total) by mouth daily with supper. Qty: 30 tablet, Refills: 0      CONTINUE these medications which have NOT CHANGED   Details  acetaminophen (TYLENOL) 500 MG tablet Take 500 mg by mouth every 6 (six) hours as needed.    ARIPiprazole (ABILIFY) 2 MG tablet Take 2 mg by mouth daily.    divalproex (DEPAKOTE) 125 MG DR tablet Take 250 mg by mouth 2 (two) times daily.     donepezil (ARICEPT) 10 MG tablet Take 10 mg by mouth at bedtime.    levothyroxine (SYNTHROID, LEVOTHROID) 50 MCG tablet Take 50 mcg by mouth daily before breakfast.    Melatonin-Pyridoxine (MELATIN PO) Take 3 mg by mouth at bedtime.    Multiple Vitamin (THEREMS PO) Take 1 tablet by mouth daily.    sertraline (ZOLOFT) 100 MG tablet Take 100 mg by mouth at bedtime.    simvastatin (ZOCOR) 40 MG tablet Take 40 mg by mouth daily at 6 PM.    sotalol (BETAPACE) 80 MG tablet Take 80 mg by mouth 2 (two) times daily.    vitamin B-12 (CYANOCOBALAMIN) 1000 MCG tablet Take 1,000 mcg by mouth daily.  STOP taking these medications     ramipril (ALTACE) 10 MG capsule         Today   VITAL SIGNS:  Blood pressure 134/64, pulse 62, temperature 98.1 F (36.7 C), temperature source Axillary, resp. rate 18, height 5\' 6"  (1.676 m), weight 58.6 kg (129 lb 1.6 oz), SpO2 97 %.  I/O:   Intake/Output Summary (Last 24 hours) at 08/30/16 1018 Last data filed at 08/30/16 1012  Gross per 24 hour  Intake              120 ml  Output                0 ml  Net              120 ml     PHYSICAL EXAMINATION:  Physical Exam  GENERAL:  82 y.o.-year-old patient lying in the bed with no acute distress.  LUNGS: Normal breath sounds bilaterally, no wheezing, rales,rhonchi or crepitation. No use of accessory muscles of respiration.  CARDIOVASCULAR: S1, S2 normal. No murmurs, rubs, or gallops.  ABDOMEN: Soft, non-tender, non-distended. Bowel sounds present. No organomegaly or mass.  NEUROLOGIC: Moves all 4 extremities. Right hip dressing. No bleeding PSYCHIATRIC: The patient is alert and awake. Pleasantly confused  DATA REVIEW:   CBC  Recent Labs Lab 08/29/16 0539  WBC 11.0*  HGB 9.0*  HCT 26.0*  PLT 196    Chemistries   Recent Labs Lab 08/26/16 0006  08/28/16 0412  NA 138  < > 135  K 2.8*  < > 4.2  CL 99*  < > 103  CO2 28  < > 26  GLUCOSE 112*  < > 141*  BUN 14  < > 16  CREATININE 0.86  < > 0.75  CALCIUM 8.6*  < > 8.1*  MG 1.6*  --   --   < > = values in this interval not displayed.  Cardiac Enzymes  Recent Labs Lab 08/26/16 0006  TROPONINI <0.03    Microbiology Results  Results for orders placed or performed during the hospital encounter of 08/25/16  MRSA PCR Screening     Status: None   Collection Time: 08/26/16  3:20 AM  Result Value Ref Range Status   MRSA by PCR NEGATIVE NEGATIVE Final    Comment:        The GeneXpert MRSA Assay (FDA approved for NASAL specimens only), is one component of a comprehensive MRSA colonization surveillance program. It is not intended to diagnose MRSA infection nor to guide or monitor treatment for MRSA infections.     RADIOLOGY:  No results found.  Follow up with PCP in 1 week.  Management plans discussed with the patient, family and they are in agreement.  CODE STATUS:     Code Status Orders        Start     Ordered   08/26/16 1129  Do not attempt resuscitation (DNR)  Continuous    Question Answer Comment  In the event of cardiac or respiratory ARREST Do not call a "code blue"   In  the event of cardiac or respiratory ARREST Do not perform Intubation, CPR, defibrillation or ACLS   In the event of cardiac or respiratory ARREST Use medication by any route, position, wound care, and other measures to relive pain and suffering. May use oxygen, suction and manual treatment of airway obstruction as needed for comfort.      08/26/16 1128    Code Status History  Date Active Date Inactive Code Status Order ID Comments User Context   08/26/2016  3:04 AM 08/26/2016 11:28 AM Full Code [id number]  Harrie Foreman, MD ED   01/26/2016  2:37 PM 01/27/2016  8:13 PM Full Code [id number]  Isaias Cowman, MD Inpatient    Advance Directive Documentation   Flowsheet Row Most Recent Value  Type of Advance Directive  Healthcare Power of Attorney  Pre-existing out of facility DNR order (yellow form or pink MOST form)  No data  "MOST" Form in Place?  No data      TOTAL TIME TAKING CARE OF THIS PATIENT ON DAY OF DISCHARGE: more than 30 minutes.   Hillary Bow R M.D on 08/30/2016 at 10:18 AM  Between 7am to 6pm - Pager - [phone]  After 6pm go to [email or web address] - password [email or web address] [facility]  Office  [phone]  CC: Primary care physician; Maryland Pink, MD  Note: This dictation was prepared with Dragon dictation along with smaller phrase technology. Any transcriptional errors that result from this process are unintentional.

## 2016-08-31 DIAGNOSIS — E440 Moderate protein-calorie malnutrition: Secondary | ICD-10-CM | POA: Diagnosis not present

## 2016-08-31 DIAGNOSIS — S7292XA Unspecified fracture of left femur, initial encounter for closed fracture: Secondary | ICD-10-CM

## 2016-08-31 DIAGNOSIS — I480 Paroxysmal atrial fibrillation: Secondary | ICD-10-CM | POA: Diagnosis not present

## 2016-08-31 DIAGNOSIS — G301 Alzheimer's disease with late onset: Secondary | ICD-10-CM

## 2016-08-31 DIAGNOSIS — F39 Unspecified mood [affective] disorder: Secondary | ICD-10-CM

## 2016-09-01 DIAGNOSIS — R509 Fever, unspecified: Secondary | ICD-10-CM

## 2016-09-21 DIAGNOSIS — S7291XA Unspecified fracture of right femur, initial encounter for closed fracture: Secondary | ICD-10-CM | POA: Diagnosis not present

## 2016-09-21 DIAGNOSIS — I481 Persistent atrial fibrillation: Secondary | ICD-10-CM | POA: Diagnosis not present

## 2016-09-21 DIAGNOSIS — F39 Unspecified mood [affective] disorder: Secondary | ICD-10-CM

## 2016-09-21 DIAGNOSIS — E440 Moderate protein-calorie malnutrition: Secondary | ICD-10-CM | POA: Diagnosis not present

## 2016-09-21 DIAGNOSIS — G301 Alzheimer's disease with late onset: Secondary | ICD-10-CM

## 2016-10-03 ENCOUNTER — Inpatient Hospital Stay
Admission: EM | Admit: 2016-10-03 | Discharge: 2016-10-06 | DRG: 871 | Disposition: A | Discharge status: Hospice | Payer: Medicare Other | Attending: Internal Medicine | Admitting: Internal Medicine

## 2016-10-03 ENCOUNTER — Emergency Department: Payer: Medicare Other

## 2016-10-03 DIAGNOSIS — I4891 Unspecified atrial fibrillation: Secondary | ICD-10-CM | POA: Diagnosis present

## 2016-10-03 DIAGNOSIS — L89151 Pressure ulcer of sacral region, stage 1: Secondary | ICD-10-CM | POA: Diagnosis present

## 2016-10-03 DIAGNOSIS — Z7901 Long term (current) use of anticoagulants: Secondary | ICD-10-CM

## 2016-10-03 DIAGNOSIS — L89621 Pressure ulcer of left heel, stage 1: Secondary | ICD-10-CM | POA: Diagnosis present

## 2016-10-03 DIAGNOSIS — J690 Pneumonitis due to inhalation of food and vomit: Secondary | ICD-10-CM

## 2016-10-03 DIAGNOSIS — I2510 Atherosclerotic heart disease of native coronary artery without angina pectoris: Secondary | ICD-10-CM | POA: Diagnosis present

## 2016-10-03 DIAGNOSIS — Z950 Presence of cardiac pacemaker: Secondary | ICD-10-CM | POA: Diagnosis not present

## 2016-10-03 DIAGNOSIS — Z96652 Presence of left artificial knee joint: Secondary | ICD-10-CM | POA: Diagnosis present

## 2016-10-03 DIAGNOSIS — Z515 Encounter for palliative care: Secondary | ICD-10-CM | POA: Diagnosis not present

## 2016-10-03 DIAGNOSIS — E860 Dehydration: Secondary | ICD-10-CM | POA: Diagnosis present

## 2016-10-03 DIAGNOSIS — J189 Pneumonia, unspecified organism: Secondary | ICD-10-CM | POA: Diagnosis present

## 2016-10-03 DIAGNOSIS — G309 Alzheimer's disease, unspecified: Secondary | ICD-10-CM | POA: Diagnosis present

## 2016-10-03 DIAGNOSIS — Z66 Do not resuscitate: Secondary | ICD-10-CM | POA: Diagnosis present

## 2016-10-03 DIAGNOSIS — Z7401 Bed confinement status: Secondary | ICD-10-CM | POA: Diagnosis not present

## 2016-10-03 DIAGNOSIS — R509 Fever, unspecified: Secondary | ICD-10-CM | POA: Diagnosis present

## 2016-10-03 DIAGNOSIS — I10 Essential (primary) hypertension: Secondary | ICD-10-CM | POA: Diagnosis present

## 2016-10-03 DIAGNOSIS — G9341 Metabolic encephalopathy: Secondary | ICD-10-CM | POA: Diagnosis present

## 2016-10-03 DIAGNOSIS — E039 Hypothyroidism, unspecified: Secondary | ICD-10-CM | POA: Diagnosis present

## 2016-10-03 DIAGNOSIS — J9601 Acute respiratory failure with hypoxia: Secondary | ICD-10-CM | POA: Diagnosis present

## 2016-10-03 DIAGNOSIS — Y95 Nosocomial condition: Secondary | ICD-10-CM | POA: Diagnosis present

## 2016-10-03 DIAGNOSIS — Z681 Body mass index (BMI) 19 or less, adult: Secondary | ICD-10-CM | POA: Diagnosis not present

## 2016-10-03 DIAGNOSIS — E876 Hypokalemia: Secondary | ICD-10-CM | POA: Diagnosis present

## 2016-10-03 DIAGNOSIS — E43 Unspecified severe protein-calorie malnutrition: Secondary | ICD-10-CM | POA: Diagnosis present

## 2016-10-03 DIAGNOSIS — E785 Hyperlipidemia, unspecified: Secondary | ICD-10-CM | POA: Diagnosis present

## 2016-10-03 DIAGNOSIS — Z8546 Personal history of malignant neoplasm of prostate: Secondary | ICD-10-CM

## 2016-10-03 DIAGNOSIS — R6520 Severe sepsis without septic shock: Secondary | ICD-10-CM | POA: Diagnosis present

## 2016-10-03 DIAGNOSIS — L89611 Pressure ulcer of right heel, stage 1: Secondary | ICD-10-CM | POA: Diagnosis present

## 2016-10-03 DIAGNOSIS — R52 Pain, unspecified: Secondary | ICD-10-CM

## 2016-10-03 DIAGNOSIS — L8990 Pressure ulcer of unspecified site, unspecified stage: Secondary | ICD-10-CM | POA: Insufficient documentation

## 2016-10-03 DIAGNOSIS — A419 Sepsis, unspecified organism: Secondary | ICD-10-CM

## 2016-10-03 DIAGNOSIS — F0280 Dementia in other diseases classified elsewhere without behavioral disturbance: Secondary | ICD-10-CM | POA: Diagnosis present

## 2016-10-03 DIAGNOSIS — Z87891 Personal history of nicotine dependence: Secondary | ICD-10-CM

## 2016-10-03 DIAGNOSIS — Z951 Presence of aortocoronary bypass graft: Secondary | ICD-10-CM

## 2016-10-03 LAB — URINALYSIS COMPLETE WITH MICROSCOPIC (ARMC ONLY)
BILIRUBIN URINE: NEGATIVE
GLUCOSE, UA: NEGATIVE mg/dL
KETONES UR: NEGATIVE mg/dL
Leukocytes, UA: NEGATIVE
NITRITE: NEGATIVE
Protein, ur: 30 mg/dL — AB
Specific Gravity, Urine: 1.031 — ABNORMAL HIGH (ref 1.005–1.030)
pH: 5 (ref 5.0–8.0)

## 2016-10-03 LAB — CBC WITH DIFFERENTIAL/PLATELET
BASOS ABS: 0.1 10*3/uL (ref 0–0.1)
BASOS PCT: 1 %
Eosinophils Absolute: 0.1 10*3/uL (ref 0–0.7)
Eosinophils Relative: 1 %
HEMATOCRIT: 36.1 % — AB (ref 40.0–52.0)
HEMOGLOBIN: 11.7 g/dL — AB (ref 13.0–18.0)
LYMPHS PCT: 8 %
Lymphs Abs: 0.9 10*3/uL — ABNORMAL LOW (ref 1.0–3.6)
MCH: 30 pg (ref 26.0–34.0)
MCHC: 32.4 g/dL (ref 32.0–36.0)
MCV: 92.6 fL (ref 80.0–100.0)
Monocytes Absolute: 0.5 10*3/uL (ref 0.2–1.0)
Monocytes Relative: 4 %
NEUTROS ABS: 9.6 10*3/uL — AB (ref 1.4–6.5)
NEUTROS PCT: 86 %
Platelets: 278 10*3/uL (ref 150–440)
RBC: 3.9 MIL/uL — AB (ref 4.40–5.90)
RDW: 16.6 % — AB (ref 11.5–14.5)
WBC: 11.1 10*3/uL — AB (ref 3.8–10.6)

## 2016-10-03 LAB — COMPREHENSIVE METABOLIC PANEL
ALBUMIN: 3 g/dL — AB (ref 3.5–5.0)
ALK PHOS: 67 U/L (ref 38–126)
ALT: 18 U/L (ref 17–63)
ANION GAP: 10 (ref 5–15)
AST: 30 U/L (ref 15–41)
BILIRUBIN TOTAL: 0.9 mg/dL (ref 0.3–1.2)
BUN: 32 mg/dL — ABNORMAL HIGH (ref 6–20)
CALCIUM: 8.6 mg/dL — AB (ref 8.9–10.3)
CO2: 22 mmol/L (ref 22–32)
Chloride: 108 mmol/L (ref 101–111)
Creatinine, Ser: 0.95 mg/dL (ref 0.61–1.24)
GFR calc non Af Amer: >60 mL/min (ref >60)
Glucose, Bld: 131 mg/dL — ABNORMAL HIGH (ref 65–99)
POTASSIUM: 3.9 mmol/L (ref 3.5–5.1)
Sodium: 140 mmol/L (ref 135–145)
TOTAL PROTEIN: 6.6 g/dL (ref 6.5–8.1)

## 2016-10-03 LAB — BLOOD GAS, ARTERIAL
ACID-BASE EXCESS: 0.8 mmol/L (ref 0.0–2.0)
Bicarbonate: 23.1 mmol/L (ref 20.0–28.0)
FIO2: 1
O2 SAT: 98.6 %
PATIENT TEMPERATURE: 37
PCO2 ART: 29 mmHg — AB (ref 32.0–48.0)
pH, Arterial: 7.51 — ABNORMAL HIGH (ref 7.350–7.450)
pO2, Arterial: 106 mmHg (ref 83.0–108.0)

## 2016-10-03 LAB — TROPONIN I

## 2016-10-03 LAB — LACTIC ACID, PLASMA
LACTIC ACID, VENOUS: 2.5 mmol/L — AB (ref 0.5–1.9)
Lactic Acid, Venous: 2.6 mmol/L (ref 0.5–1.9)

## 2016-10-03 LAB — GLUCOSE, CAPILLARY: Glucose-Capillary: 134 mg/dL — ABNORMAL HIGH (ref 65–99)

## 2016-10-03 MED ORDER — SOTALOL HCL 80 MG PO TABS
80.0000 mg | ORAL_TABLET | Freq: Two times a day (BID) | ORAL | Status: DC
  Administered 2016-10-04 (×3): 80 mg via ORAL
  Filled 2016-10-03 (×4): qty 1

## 2016-10-03 MED ORDER — ACETAMINOPHEN 325 MG PO TABS: 650.0000 mg | ORAL_TABLET | Freq: Four times a day (QID) | ORAL | Status: DC | PRN

## 2016-10-03 MED ORDER — SODIUM CHLORIDE 0.9% FLUSH
3.0000 mL | Freq: Two times a day (BID) | INTRAVENOUS | Status: DC
  Administered 2016-10-03 – 2016-10-06 (×5): 3 mL via INTRAVENOUS

## 2016-10-03 MED ORDER — CEFEPIME-DEXTROSE 2 GM/50ML IV SOLR: 2.0000 g | Freq: Once | INTRAVENOUS | Status: DC

## 2016-10-03 MED ORDER — ACETAMINOPHEN 650 MG RE SUPP
650.0000 mg | Freq: Once | RECTAL | Status: AC
  Administered 2016-10-03: 650 mg via RECTAL

## 2016-10-03 MED ORDER — RIVAROXABAN 10 MG PO TABS: 20.0000 mg | ORAL_TABLET | Freq: Every day | ORAL | Status: DC

## 2016-10-03 MED ORDER — ONDANSETRON HCL 4 MG/2ML IJ SOLN: 4.0000 mg | Freq: Four times a day (QID) | INTRAMUSCULAR | Status: DC | PRN

## 2016-10-03 MED ORDER — ACETAMINOPHEN 650 MG RE SUPP
650.0000 mg | Freq: Four times a day (QID) | RECTAL | Status: DC | PRN
  Administered 2016-10-04: 650 mg via RECTAL
  Filled 2016-10-03: qty 1

## 2016-10-03 MED ORDER — HYDROCODONE-ACETAMINOPHEN 5-325 MG PO TABS
1.0000 | ORAL_TABLET | ORAL | Status: DC | PRN
  Administered 2016-10-04: 2 via ORAL
  Filled 2016-10-03: qty 2

## 2016-10-03 MED ORDER — VANCOMYCIN HCL IN DEXTROSE 1-5 GM/200ML-% IV SOLN: 1000.0000 mg | Freq: Once | INTRAVENOUS | Status: DC

## 2016-10-03 MED ORDER — SODIUM CHLORIDE 0.9 % IV BOLUS (SEPSIS): 1000.0000 mL | Freq: Once | INTRAVENOUS | Status: DC

## 2016-10-03 MED ORDER — PIPERACILLIN-TAZOBACTAM 3.375 G IVPB 30 MIN
3.3750 g | Freq: Once | INTRAVENOUS | Status: AC
  Administered 2016-10-03: 3.375 g via INTRAVENOUS
  Filled 2016-10-03: qty 50

## 2016-10-03 MED ORDER — DIVALPROEX SODIUM 125 MG PO DR TAB
125.0000 mg | DELAYED_RELEASE_TABLET | Freq: Two times a day (BID) | ORAL | Status: DC
  Administered 2016-10-04: 125 mg via ORAL
  Filled 2016-10-03 (×3): qty 1

## 2016-10-03 MED ORDER — ACETAMINOPHEN 650 MG RE SUPP
RECTAL | Status: AC
  Filled 2016-10-03: qty 1

## 2016-10-03 MED ORDER — SODIUM CHLORIDE 0.9 % IV BOLUS (SEPSIS)
1000.0000 mL | Freq: Once | INTRAVENOUS | Status: AC
  Administered 2016-10-03 (×2): 1000 mL via INTRAVENOUS

## 2016-10-03 MED ORDER — VANCOMYCIN HCL IN DEXTROSE 1-5 GM/200ML-% IV SOLN
1000.0000 mg | Freq: Once | INTRAVENOUS | Status: AC
  Administered 2016-10-03: 1000 mg via INTRAVENOUS
  Filled 2016-10-03: qty 200

## 2016-10-03 MED ORDER — ACETAMINOPHEN 650 MG RE SUPP
RECTAL | Status: AC
  Administered 2016-10-03: 650 mg via RECTAL
  Filled 2016-10-03: qty 1

## 2016-10-03 MED ORDER — IPRATROPIUM-ALBUTEROL 0.5-2.5 (3) MG/3ML IN SOLN
3.0000 mL | Freq: Four times a day (QID) | RESPIRATORY_TRACT | Status: DC
  Administered 2016-10-03 – 2016-10-05 (×6): 3 mL via RESPIRATORY_TRACT
  Filled 2016-10-03 (×7): qty 3

## 2016-10-03 MED ORDER — SENNOSIDES-DOCUSATE SODIUM 8.6-50 MG PO TABS: 1.0000 | ORAL_TABLET | Freq: Every evening | ORAL | Status: DC | PRN

## 2016-10-03 MED ORDER — LEVOTHYROXINE SODIUM 50 MCG PO TABS
50.0000 ug | ORAL_TABLET | Freq: Every day | ORAL | Status: DC
  Administered 2016-10-04: 50 ug via ORAL
  Filled 2016-10-03: qty 1

## 2016-10-03 MED ORDER — SODIUM CHLORIDE 0.9 % IV SOLN
INTRAVENOUS | Status: DC
  Administered 2016-10-03 – 2016-10-05 (×2): via INTRAVENOUS

## 2016-10-03 MED ORDER — VANCOMYCIN HCL IN DEXTROSE 750-5 MG/150ML-% IV SOLN
750.0000 mg | INTRAVENOUS | Status: DC
  Administered 2016-10-04: 750 mg via INTRAVENOUS
  Filled 2016-10-03 (×2): qty 150

## 2016-10-03 MED ORDER — DEXTROSE 5 % IV SOLN
2.0000 g | Freq: Every day | INTRAVENOUS | Status: DC
  Administered 2016-10-04: 2 g via INTRAVENOUS
  Filled 2016-10-03 (×2): qty 2

## 2016-10-03 MED ORDER — VITAMIN B-12 1000 MCG PO TABS
1000.0000 ug | ORAL_TABLET | Freq: Every day | ORAL | Status: DC
  Administered 2016-10-04: 1000 ug via ORAL
  Filled 2016-10-03: qty 1

## 2016-10-03 MED ORDER — ONDANSETRON HCL 4 MG PO TABS: 4.0000 mg | ORAL_TABLET | Freq: Four times a day (QID) | ORAL | Status: DC | PRN

## 2016-10-03 NOTE — ED Notes (Signed)
Dr pyreddy at bedside

## 2016-10-03 NOTE — ED Notes (Signed)
resp to called for abg

## 2016-10-03 NOTE — ED Notes (Signed)
Repeat lactic result given to dr pyreddy

## 2016-10-03 NOTE — Consult Note (Signed)
Pharmacy Antibiotic Note  Steve Aguirre is a 82 y.o. male admitted on 10/03/2016 with pneumonia.  Pharmacy has been consulted for cefepime and vancomycin dosing.  Plan: Pt received 1g of vancomycin in the ED. Will give next dose in 8 hours for stacked dosing Vancomycin 750 IV every 18 hours.  Goal trough 15-20 mcg/mL. cefepime 2g q 24 hours  vanc trough at steady state 10/18 @ 0830  Height: 5\' 9"  (175.3 cm) Weight: 134 lb 9.6 oz (61.1 kg) IBW/kg (Calculated) : 70.7  Temp (24hrs), Avg:102.9 F (39.4 C), Min:102.3 F (39.1 C), Max:103.2 F (39.6 C)   Recent Labs Lab 10/03/16 1857 10/03/16 1903  WBC 11.1*  --   CREATININE 0.95  --   LATICACIDVEN  --  2.6*    Estimated Creatinine Clearance: 47.3 mL/min (by C-G formula based on SCr of 0.95 mg/dL).    No Known Allergies  Antimicrobials this admission: vancomycin 10/15 >>  zosyn 10/15 >> 10/15 Cefepime 10/15>>  Dose adjustments this admission:   Microbiology results: 10/15 BCx:  10/15 UCx:     Thank you for allowing pharmacy to be a part of this patient's care.  Ramond Dial 10/03/2016 9:41 PM

## 2016-10-03 NOTE — ED Triage Notes (Signed)
Pt came to ED via EMS from [facility]. Per staff at nursing home, pt may have aspirated. Pt alert, not responding. Temperature 104. Pt coughing on arrival. Sats low 80s on room air.

## 2016-10-03 NOTE — ED Notes (Signed)
Pt sitting upright in bed, cont to cough and sound very congested, resp are at 32, cont to monitor

## 2016-10-03 NOTE — ED Notes (Signed)
Pt resting quietly, no distress, cont to monitor

## 2016-10-03 NOTE — H&P (Signed)
[facility] NAME: Steve Aguirre    MR#:  [id number]  DATE OF BIRTH:  22-Apr-1927  DATE OF ADMISSION:  10/03/2016  PRIMARY CARE PHYSICIAN: Maryland Pink, MD   REQUESTING/REFERRING PHYSICIAN:   CHIEF COMPLAINT:   Chief Complaint  Patient presents with  . Altered Mental Status    HISTORY OF PRESENT ILLNESS: Steve Aguirre  is a 82 y.o. male with a known history of lewy body dementia, atrial fibrillation, prostate cancer, coronary artery disease, hyperlipidemia, hypertension he is a resident of [facility]. Patient was found to be hypoxic short of breath and had fever. He was also lethargic and confused and he was transferred via EMS to our hospital emergency room. Patient was put on oxygen by nasal cannula at 4 L by the EMS and he still was saturating around 80%. Patient is mostly bedbound in the nursing home dependent on activities of daily living. He is DO NOT RESUSCITATE by CODE STATUS. After arrival to the emergency room was put on oxygen via nonrebreather mask and O2 sats came up to 90%. Patient was worked up his chest x-ray showed left lung pneumonia and he was started on broad-spectrum antibiotics. Patient is more awake and verbally responsive in the emergency room and breathing comfortably. No history of fall or head injury. No complaints of any chest pain. Has cough, shortness of breath and fever. Fever was 10 42F when he was in the nursing home. Cough is productive of yellowish phlegm.  PAST MEDICAL HISTORY:   Past Medical History:  Diagnosis Date  . Arthritis   . Atrial fibrillation ([place])   . Cancer [facility])    prostate  . Coronary artery disease   . Hyperlipemia   . Hypertension   . Scarlet fever     PAST SURGICAL HISTORY: Past Surgical History:  Procedure Laterality Date  . CARDIAC CATHETERIZATION    . CATARACT EXTRACTION W/ INTRAOCULAR LENS  IMPLANT, BILATERAL Bilateral   . CORONARY ARTERY BYPASS GRAFT     . INTRAMEDULLARY (IM) NAIL INTERTROCHANTERIC Right 08/27/2016   Procedure: INTRAMEDULLARY (IM) NAIL INTERTROCHANTRIC;  Surgeon: Hessie Knows, MD;  Location: [facility] ORS;  Service: Orthopedics;  Laterality: Right;  . JOINT REPLACEMENT     eft knee  . NASAL SINUS SURGERY    . PACEMAKER INSERTION N/A 01/26/2016   Procedure: INSERTION PACEMAKER;  Surgeon: Isaias Cowman, MD;  Location: [facility] ORS;  Service: Cardiovascular;  Laterality: N/A;  . PROSTATE SURGERY      SOCIAL HISTORY:  Social History  Substance Use Topics  . Smoking status: Former Research scientist (life sciences)  . Smokeless tobacco: Never Used  . Alcohol use No     Comment: occ.    FAMILY HISTORY: No family history on file.  DRUG ALLERGIES: No Known Allergies  REVIEW OF SYSTEMS:   CONSTITUTIONAL: Has fever and weakness.  EYES: No blurred or double vision.  EARS, NOSE, AND THROAT: No tinnitus or ear pain.  RESPIRATORY: Has cough, shortness of breath,  No wheezing or hemoptysis.  CARDIOVASCULAR: No chest pain, orthopnea, edema.  GASTROINTESTINAL: No nausea, vomiting, diarrhea or abdominal pain.  GENITOURINARY: No dysuria, hematuria.  ENDOCRINE: No polyuria, nocturia,  HEMATOLOGY: No anemia, easy bruising or bleeding SKIN: No rash or lesion. MUSCULOSKELETAL: No joint pain or arthritis.   NEUROLOGIC: No tingling, numbness, weakness.  PSYCHIATRY: No anxiety or depression.   MEDICATIONS AT HOME:  Prior to Admission medications   Medication Sig Start Date End Date Taking? Authorizing  Provider  acetaminophen (TYLENOL) 325 MG tablet Take 1,000 mg by mouth 3 (three) times daily as needed.    Yes Historical Provider, MD  divalproex (DEPAKOTE) 125 MG DR tablet Take 125 mg by mouth 2 (two) times daily.  09/22/16 10/06/16 Yes Historical Provider, MD  levothyroxine (SYNTHROID, LEVOTHROID) 50 MCG tablet Take 50 mcg by mouth daily before breakfast.   Yes Historical Provider, MD  Multiple Vitamin (THEREMS PO) Take 1 tablet by mouth daily.   Yes Historical  Provider, MD  rivaroxaban (XARELTO) 20 MG TABS tablet Take 1 tablet (20 mg total) by mouth daily with supper. 08/28/16  Yes Reche Dixon, PA-C  sotalol (BETAPACE) 80 MG tablet Take 80 mg by mouth 2 (two) times daily.   Yes Historical Provider, MD  vitamin B-12 (CYANOCOBALAMIN) 1000 MCG tablet Take 1,000 mcg by mouth daily.   Yes Historical Provider, MD      PHYSICAL EXAMINATION:   VITAL SIGNS: Blood pressure (!) 91/46, pulse 62, temperature (!) 102.3 F (39.1 C), resp. rate 16, height 5\' 9"  (1.753 m), weight 61.1 kg (134 lb 9.6 oz), SpO2 100 %.  GENERAL:  82 y.o.-year-old patient lying in the bed with mild respiratory distress distress.  EYES: Pupils equal, round, reactive to light and accommodation. No scleral icterus. Extraocular muscles intact.  HEENT: Head atraumatic, normocephalic. Oropharynx dry and nasopharynx clear.  NECK:  Supple, no jugular venous distention. No thyroid enlargement, no tenderness.  LUNGS: Decresed breath sounds bilaterally, Left lower lobe rales heard. No use of accessory muscles of respiration.  CARDIOVASCULAR: S1, S2 normal. No murmurs, rubs, or gallops.  ABDOMEN: Soft, nontender, nondistended. Bowel sounds present. No organomegaly or mass.  EXTREMITIES: No pedal edema, cyanosis, or clubbing.  NEUROLOGIC: Cranial nerves II through XII are intact. Muscle strength 5/5 in all extremities. Sensation intact. Gait not checked.  PSYCHIATRIC: The patient is alert and oriented x 2 SKIN: No obvious rash, lesion, or ulcer.   LABORATORY PANEL:   CBC  Recent Labs Lab 10/03/16 1857  WBC 11.1*  HGB 11.7*  HCT 36.1*  PLT 278  MCV 92.6  MCH 30.0  MCHC 32.4  RDW 16.6*  LYMPHSABS 0.9*  MONOABS 0.5  EOSABS 0.1  BASOSABS 0.1   ------------------------------------------------------------------------------------------------------------------  Chemistries   Recent Labs Lab 10/03/16 1857  NA 140  K 3.9  CL 108  CO2 22  GLUCOSE 131*  BUN 32*  CREATININE 0.95   CALCIUM 8.6*  AST 30  ALT 18  ALKPHOS 67  BILITOT 0.9   ------------------------------------------------------------------------------------------------------------------ estimated creatinine clearance is 47.3 mL/min (by C-G formula based on SCr of 0.95 mg/dL). ------------------------------------------------------------------------------------------------------------------ No results for input(s): TSH, T4TOTAL, T3FREE, THYROIDAB in the last 72 hours.  Invalid input(s): FREET3   Coagulation profile No results for input(s): INR, PROTIME in the last 168 hours. ------------------------------------------------------------------------------------------------------------------- No results for input(s): DDIMER in the last 72 hours. -------------------------------------------------------------------------------------------------------------------  Cardiac Enzymes  Recent Labs Lab 10/03/16 1857  TROPONINI <0.03   ------------------------------------------------------------------------------------------------------------------ Invalid input(s): POCBNP  ---------------------------------------------------------------------------------------------------------------  Urinalysis    Component Value Date/Time   COLORURINE AMBER (A) 08/11/2016 1816   APPEARANCEUR CLEAR (A) 08/11/2016 1816   APPEARANCEUR Clear 11/27/2014 1727   LABSPEC 1.026 08/11/2016 1816   LABSPEC 1.027 11/27/2014 1727   PHURINE 5.0 08/11/2016 1816   GLUCOSEU NEGATIVE 08/11/2016 1816   GLUCOSEU Negative 11/27/2014 1727   HGBUR NEGATIVE 08/11/2016 1816   BILIRUBINUR NEGATIVE 08/11/2016 1816   BILIRUBINUR Negative 11/27/2014 1727   KETONESUR 2+ (A) 08/11/2016 1816   PROTEINUR 100 (A)  08/11/2016 1816   NITRITE NEGATIVE 08/11/2016 1816   LEUKOCYTESUR NEGATIVE 08/11/2016 1816   LEUKOCYTESUR Negative 11/27/2014 1727     RADIOLOGY: Dg Chest Port 1 View  Result Date: 10/03/2016 CLINICAL DATA:  Short of breath,  cough, fever EXAM: PORTABLE CHEST 1 VIEW COMPARISON:  08/25/2016 FINDINGS: Left lower lobe infiltrate has developed since the prior study and is compatible with pneumonia. No effusion. Prior CABG. Single lead pacemaker unchanged in position. COPD with hyperinflation. IMPRESSION: Left lower lobe infiltrate compatible with pneumonia Electronically Signed   By: Franchot Gallo M.D.   On: 10/03/2016 19:37    EKG: Orders placed or performed during the hospital encounter of 10/03/16  . ED EKG 12-Lead  . ED EKG 12-Lead    IMPRESSION AND PLAN: 82 year old elderly male patient who is a resident of nursing home presented to the emergency room with cough, fever, shortness of breath and lethargy. He was put on oxygen via nonrebreather mask and stabilized in the emergency room. Admitting diagnosis 1. Healthcare associated pneumonia 2. Altered mental status secondary to pneumonia 3. Hypoxia secondary to pneumonia 4. Dehydration 5. Sepsis secondary to pneumonia Treatment plan 1. Admit patient to stepdown unit 2. Add broad-spectrum IV antibiotics IV vancomycin and IV cefepime 3. And already received 2 L of fluid in the emergency room. We will continue maintenance fluids with normal saline 4. Resume oral Xarelto for anticoagulation 5. Follow blood cultures 6. Breathing treatments 7. Supportive care  All the records are reviewed and case discussed with ED provider. Management plans discussed with the patient, family and they are in agreement.  CODE STATUS:DNR & DNI Health care power of attorney : Ashok Norris (daughter)                                                   [name] (wife)    Code Status Orders        Start     Ordered   10/03/16 2117  Do not attempt resuscitation (DNR)  Continuous    Question Answer Comment  In the event of cardiac or respiratory ARREST Do not call a "code blue"   In the event of cardiac or respiratory ARREST Do not perform Intubation, CPR, defibrillation or ACLS   In  the event of cardiac or respiratory ARREST Use medication by any route, position, wound care, and other measures to relive pain and suffering. May use oxygen, suction and manual treatment of airway obstruction as needed for comfort.      10/03/16 2117    Code Status History    Date Active Date Inactive Code Status Order ID Comments User Context   08/26/2016 11:28 AM 08/30/2016  4:45 PM DNR [id number]  Theodoro Grist, MD Inpatient   08/26/2016  3:04 AM 08/26/2016 11:28 AM Full Code [id number]  Harrie Foreman, MD ED   01/26/2016  2:37 PM 01/27/2016  8:13 PM Full Code [id number]  Isaias Cowman, MD Inpatient    Advance Directive Documentation   Flowsheet Row Most Recent Value  Type of Advance Directive  Out of facility DNR (pink MOST or yellow form)  Pre-existing out of facility DNR order (yellow form or pink MOST form)  No data  "MOST" Form in Place?  No data       TOTAL TIME TAKING CARE OF THIS PATIENT: 57 minutes.    Linton Stolp  M.D on 10/03/2016 at 9:19 PM  Between 7am to 6pm - Pager - [phone]  After 6pm go to [email or web address] - password [email or web address] [facility]  [facility] [place] [facility]  Office  [phone]  CC: Primary care physician; Maryland Pink, MD

## 2016-10-03 NOTE — ED Provider Notes (Signed)
[facility] Emergency Department Provider Note  Time seen: 6:51 PM  I have reviewed the triage vital signs and the nursing notes.   HISTORY  Chief Complaint No chief complaint on file.    HPI Steve Aguirre is a 82 y.o. male who presents to the emergency department from a nursing facility with decreased responsiveness, hypoxia and fever. According to EMS report the patient has a history of aspiration pneumonia in the past. No one physically witnessed the patient aspirated, but stated he has been coughing and it sounds very wet, noted to have a fever to 104 today, and hypoxic, placed on 4 L via nasal cannula at his nursing facility satting in the upper 80s, normally does not wear oxygen. EMS states nursing staff reports the patient's baseline is demented in argumentative/combative at times, currently the patient is calm, is alert, but does not answer questions, does not follow commands.  Past Medical History:  Diagnosis Date  . Arthritis   . Atrial fibrillation ([place])   . Cancer [facility])    prostate  . Coronary artery disease   . Hyperlipemia   . Hypertension   . Scarlet fever     Patient Active Problem List   Diagnosis Date Noted  . Closed right hip fracture ([place]) 08/26/2016  . Atrial fibrillation ([place]) 06/20/2016  . HTN (hypertension) 06/20/2016  . Dyslipidemia 06/20/2016  . Alzheimer's dementia without behavioral disturbance 06/20/2016  . Sick sinus syndrome ([place]) 01/26/2016    Past Surgical History:  Procedure Laterality Date  . CARDIAC CATHETERIZATION    . CATARACT EXTRACTION W/ INTRAOCULAR LENS  IMPLANT, BILATERAL Bilateral   . CORONARY ARTERY BYPASS GRAFT    . INTRAMEDULLARY (IM) NAIL INTERTROCHANTERIC Right 08/27/2016   Procedure: INTRAMEDULLARY (IM) NAIL INTERTROCHANTRIC;  Surgeon: Hessie Knows, MD;  Location: [facility] ORS;  Service: Orthopedics;  Laterality: Right;  . JOINT REPLACEMENT     eft knee  . NASAL SINUS SURGERY    . PACEMAKER INSERTION  N/A 01/26/2016   Procedure: INSERTION PACEMAKER;  Surgeon: Isaias Cowman, MD;  Location: [facility] ORS;  Service: Cardiovascular;  Laterality: N/A;  . PROSTATE SURGERY      Prior to Admission medications   Medication Sig Start Date End Date Taking? Authorizing Provider  acetaminophen (TYLENOL) 500 MG tablet Take 500 mg by mouth every 6 (six) hours as needed.    Historical Provider, MD  ARIPiprazole (ABILIFY) 2 MG tablet Take 2 mg by mouth daily.    Historical Provider, MD  divalproex (DEPAKOTE) 125 MG DR tablet Take 250 mg by mouth 2 (two) times daily.     Historical Provider, MD  donepezil (ARICEPT) 10 MG tablet Take 10 mg by mouth at bedtime.    Historical Provider, MD  HYDROcodone-acetaminophen (NORCO/VICODIN) 5-325 MG tablet Take 1-2 tablets by mouth every 6 (six) hours as needed for moderate pain. 08/28/16   Reche Dixon, PA-C  levothyroxine (SYNTHROID, LEVOTHROID) 50 MCG tablet Take 50 mcg by mouth daily before breakfast.    Historical Provider, MD  Melatonin-Pyridoxine (MELATIN PO) Take 3 mg by mouth at bedtime.    Historical Provider, MD  Multiple Vitamin (THEREMS PO) Take 1 tablet by mouth daily.    Historical Provider, MD  rivaroxaban (XARELTO) 20 MG TABS tablet Take 1 tablet (20 mg total) by mouth daily with supper. 08/28/16   Reche Dixon, PA-C  sertraline (ZOLOFT) 100 MG tablet Take 100 mg by mouth at bedtime.    Historical Provider, MD  simvastatin (ZOCOR) 40 MG tablet Take  40 mg by mouth daily at 6 PM.    Historical Provider, MD  sotalol (BETAPACE) 80 MG tablet Take 80 mg by mouth 2 (two) times daily.    Historical Provider, MD  vitamin B-12 (CYANOCOBALAMIN) 1000 MCG tablet Take 1,000 mcg by mouth daily.    Historical Provider, MD    No Known Allergies  No family history on file.  Social History Social History  Substance Use Topics  . Smoking status: Former Research scientist (life sciences)  . Smokeless tobacco: Never Used  . Alcohol use No     Comment: occ.    Review of Systems Unable to obtain a  review of systems due to altered mental status  ____________________________________________   PHYSICAL EXAM:  Constitutional: Alert, awake, looks around room, will smile, but does not answer questions or follow commands. Eyes: Normal exam ENT   Head: Normocephalic and atraumatic.   Nose: No congestion/rhinnorhea.   Mouth/Throat: Mucous membranes are moist. Cardiovascular: Normal rate, regular rhythm. Respiratory: Moderate tachypnea, diffuse rhonchi, left greater than right. No wheeze. Gastrointestinal: Soft and nontender. No distention.   Musculoskeletal: Nontender with normal range of motion in all extremities. Appears to move all extremities. Neurologic: Patient is not speaking, appears to move all extremities at times. Skin:  Skin is warm, dry and intact.  Psychiatric: Alert, smiles, however does not answer questions or follow commands. Unable to perform an adequate psychiatric evaluation.  ____________________________________________    EKG  EKG reviewed and interpreted by myself shows atrial fibrillation/flutter versus electrical interference, with ventricular paced complexes at 73 bpm. Widened QRS. Nonspecific ST changes.  ____________________________________________    RADIOLOGY  Chest x-ray consistent with left lower lobe pneumonia  ____________________________________________   INITIAL IMPRESSION / ASSESSMENT AND PLAN / ED COURSE  Pertinent labs & imaging results that were available during my care of the patient were reviewed by me and considered in my medical decision making (see chart for details).  The patient presents from his nursing facility with signs suggestive of respiratory illness, meeting sepsis criteria due to fever and tachypnea. We will check labs, IV hydrate, cover empirically with IV antibiotics. Patient will require admission to the hospital once his work is completed for further treatment.  EMS confirms the patient has a DO NOT  RESUSCITATE order.  X-ray consistent with left lower lobe pneumonia. Lactate elevated at 2.6. Patient receiving empiric anabiotic some fluids. Blood pressure remains 123456 systolic. Family is now here and state this is a normal blood pressure for him. Patient is largely nonambulatory at baseline, no longer pertussis patent physical therapy sessions. Patient will be admitted for further treatment.   CRITICAL CARE Performed by: Harvest Dark   Total critical care time: 30 minutes  Critical care time was exclusive of separately billable procedures and treating other patients.  Critical care was necessary to treat or prevent imminent or life-threatening deterioration.  Critical care was time spent personally by me on the following activities: development of treatment plan with patient and/or surrogate as well as nursing, discussions with consultants, evaluation of patient's response to treatment, examination of patient, obtaining history from patient or surrogate, ordering and performing treatments and interventions, ordering and review of laboratory studies, ordering and review of radiographic studies, pulse oximetry and re-evaluation of patient's condition.  ____________________________________________   FINAL CLINICAL IMPRESSION(S) / ED DIAGNOSES  Sepsis Pneumonia   Harvest Dark, MD 10/03/16 2021

## 2016-10-04 DIAGNOSIS — L8990 Pressure ulcer of unspecified site, unspecified stage: Secondary | ICD-10-CM | POA: Insufficient documentation

## 2016-10-04 LAB — BASIC METABOLIC PANEL
ANION GAP: 7 (ref 5–15)
BUN: 30 mg/dL — ABNORMAL HIGH (ref 6–20)
CO2: 21 mmol/L — AB (ref 22–32)
Calcium: 8 mg/dL — ABNORMAL LOW (ref 8.9–10.3)
Chloride: 112 mmol/L — ABNORMAL HIGH (ref 101–111)
Creatinine, Ser: 0.9 mg/dL (ref 0.61–1.24)
GFR calc Af Amer: >60 mL/min (ref >60)
GLUCOSE: 160 mg/dL — AB (ref 65–99)
POTASSIUM: 3.3 mmol/L — AB (ref 3.5–5.1)
Sodium: 140 mmol/L (ref 135–145)

## 2016-10-04 LAB — CBC
HEMATOCRIT: 30.8 % — AB (ref 40.0–52.0)
HEMOGLOBIN: 10.1 g/dL — AB (ref 13.0–18.0)
MCH: 30.3 pg (ref 26.0–34.0)
MCHC: 32.9 g/dL (ref 32.0–36.0)
MCV: 92 fL (ref 80.0–100.0)
Platelets: 195 10*3/uL (ref 150–440)
RBC: 3.35 MIL/uL — AB (ref 4.40–5.90)
RDW: 16.3 % — ABNORMAL HIGH (ref 11.5–14.5)
WBC: 9.7 10*3/uL (ref 3.8–10.6)

## 2016-10-04 LAB — MAGNESIUM: Magnesium: 1.7 mg/dL (ref 1.7–2.4)

## 2016-10-04 LAB — MRSA PCR SCREENING: MRSA BY PCR: NEGATIVE

## 2016-10-04 LAB — LACTIC ACID, PLASMA: Lactic Acid, Venous: 2.7 mmol/L (ref 0.5–1.9)

## 2016-10-04 MED ORDER — MAGNESIUM SULFATE 2 GM/50ML IV SOLN
2.0000 g | Freq: Once | INTRAVENOUS | Status: AC
  Administered 2016-10-04: 2 g via INTRAVENOUS
  Filled 2016-10-04: qty 50

## 2016-10-04 MED ORDER — RIVAROXABAN 15 MG PO TABS
15.0000 mg | ORAL_TABLET | Freq: Every day | ORAL | Status: DC
  Administered 2016-10-04: 15 mg via ORAL
  Filled 2016-10-04: qty 1

## 2016-10-04 MED ORDER — VALPROATE SODIUM 250 MG/5ML PO SOLN
125.0000 mg | Freq: Two times a day (BID) | ORAL | Status: DC
  Administered 2016-10-04 – 2016-10-05 (×3): 125 mg via ORAL
  Filled 2016-10-04 (×6): qty 2.5

## 2016-10-04 MED ORDER — POTASSIUM CHLORIDE CRYS ER 20 MEQ PO TBCR
20.0000 meq | EXTENDED_RELEASE_TABLET | Freq: Once | ORAL | Status: AC
  Administered 2016-10-04: 20 meq via ORAL
  Filled 2016-10-04: qty 1

## 2016-10-04 MED ORDER — DIVALPROEX SODIUM 125 MG PO DR TAB: 125.0000 mg | DELAYED_RELEASE_TABLET | Freq: Two times a day (BID) | ORAL | Status: DC

## 2016-10-04 NOTE — Progress Notes (Signed)
Chaplain was completing his rounds and entered room 14. Provided the ministry of scripture reading, prayer and emotional support.    10/04/16 1000  Clinical Encounter Type  Visited With Patient  Visit Type Initial  Referral From Nurse  Spiritual Encounters  Spiritual Needs [facility] text;Prayer;Emotional

## 2016-10-04 NOTE — Consult Note (Addendum)
Pharmacy Antibiotic Note/Electrolyte Monitoring  Steve Aguirre is a 82 y.o. male admitted on 10/03/2016 with pneumonia.  Pharmacy has been consulted for cefepime and vancomycin dosing. MRSA PCR negative and vancomycin d/c.   Plan: 1. Continue cefepime 2 g iv q 24 hours.  2. Potassium and magnesium replaced. Will f/u AM labs.   Height: 5\' 7"  (170.2 cm) Weight: 124 lb 1.9 oz (56.3 kg) IBW/kg (Calculated) : 66.1  Temp (24hrs), Avg:100.2 F (37.9 C), Min:86.4 F (30.2 C), Max:103.2 F (39.6 C)   Recent Labs Lab 10/03/16 1857 10/03/16 1903 10/03/16 2214 10/04/16 0302  WBC 11.1*  --   --  9.7  CREATININE 0.95  --   --  0.90  LATICACIDVEN  --  2.6* 2.5* 2.7*    Estimated Creatinine Clearance: 46 mL/min (by C-G formula based on SCr of 0.9 mg/dL).    BMP Latest Ref Rng & Units 10/04/2016 10/03/2016 08/28/2016  Glucose 65 - 99 mg/dL 160(H) 131(H) 141(H)  BUN 6 - 20 mg/dL 30(H) 32(H) 16  Creatinine 0.61 - 1.24 mg/dL 0.90 0.95 0.75  Sodium 135 - 145 mmol/L 140 140 135  Potassium 3.5 - 5.1 mmol/L 3.3(L) 3.9 4.2  Chloride 101 - 111 mmol/L 112(H) 108 103  CO2 22 - 32 mmol/L 21(L) 22 26  Calcium 8.9 - 10.3 mg/dL 8.0(L) 8.6(L) 8.1(L)   CMP Latest Ref Rng & Units 10/04/2016 10/03/2016 08/28/2016  Glucose 65 - 99 mg/dL 160(H) 131(H) 141(H)  BUN 6 - 20 mg/dL 30(H) 32(H) 16  Creatinine 0.61 - 1.24 mg/dL 0.90 0.95 0.75  Sodium 135 - 145 mmol/L 140 140 135  Potassium 3.5 - 5.1 mmol/L 3.3(L) 3.9 4.2  Chloride 101 - 111 mmol/L 112(H) 108 103  CO2 22 - 32 mmol/L 21(L) 22 26  Calcium 8.9 - 10.3 mg/dL 8.0(L) 8.6(L) 8.1(L)  Total Protein 6.5 - 8.1 g/dL - 6.6 -  Total Bilirubin 0.3 - 1.2 mg/dL - 0.9 -  Alkaline Phos 38 - 126 U/L - 67 -  AST 15 - 41 U/L - 30 -  ALT 17 - 63 U/L - 18 -    No Known Allergies  Antimicrobials this admission: vancomycin 10/15 >> 10/16 zosyn 10/15 >> 10/15 Cefepime 10/15>>  Dose adjustments this admission:   Microbiology results: 10/15 BCx: NGTD x 2 10/15  UCx:  Pending 10/15 MRSA PCR: negative   Thank you for allowing pharmacy to be a part of this patient's care.  Ulice Dash D 10/04/2016 11:49 AM

## 2016-10-04 NOTE — Clinical Social Work Note (Signed)
Clinical Social Work Assessment  Patient Details  Name: Steve Aguirre MRN: [id number] Date of Birth: March 25, 1927  Date of referral:  10/04/16               Reason for consult:  Discharge Planning (patient admitted from SNF)                Permission sought to share information with:  Case Manager, Facility Sport and exercise psychologist, Psychiatrist Permission granted to share information::     Name::        Agency::  [facility]: [facility]  Relationship::  Wife: Maura Crandall Information:     Housing/Transportation Living arrangements for the past 2 months:  [place] of Information:  Scientist, water quality, Tourist information centre manager, Facility, Spouse Patient Interpreter Needed:  None Criminal Activity/Legal Involvement Pertinent to Current Situation/Hospitalization:  No - Comment as needed Significant Relationships:  Warehouse manager, Adult Children, Spouse Lives with:  Facility Resident Do you feel safe going back to the place where you live?  Yes Need for family participation in patient care:  Yes (Comment) (disoriented)  Care giving concerns:  No family at the bedside during assessment, Call placed to wife and also to [place] [facility]. Patient recently placed at the end of September from his ALF and needing higher level.  Remains at [facility] and able to return at DC. Goals of Care has been requested.  Will follow up on recommendations   Social Worker assessment / plan:  LCSW received consult that patient was admitted from facility: [facility]. Called wife, unable to reach.  Will follow up with her wishes. Patient unable to participate, but facility called and states patient can return when medically stable and updated on current status. No current needs at this time. Anticipating discharge back to SNF once medically stable.  Patient not at baseline. FL2 has been started, but will need update prior to return.  Will continue to follow with needs   Employment status:   Retired Forensic scientist:  Medicare PT Recommendations:  Not assessed at this time Information / Referral to community resources:  [place]  Patient/Family's Response to care:  WIll follow up, message left for wife  Patient/Family's Understanding of and Emotional Response to Diagnosis, Current Treatment, and Prognosis:  Unknown at this time.  Facility understanding of admission and needs.    Emotional Assessment Appearance:  Appears stated age Attitude/Demeanor/Rapport:  Other (disoriented) Affect (typically observed):  Accepting, Adaptable Orientation:  Oriented to Self Alcohol / Substance use:  Not Applicable Psych involvement (Current and /or in the community):  No (Comment)  Discharge Needs  Concerns to be addressed:  No discharge needs identified Readmission within the last 30 days:  Yes Current discharge risk:  None Barriers to Discharge:  No Barriers Identified, Continued Medical Work up   [place], LCSW 10/04/2016, 11:45 AM

## 2016-10-04 NOTE — Progress Notes (Signed)
82 y/o M with a h/o AF on rivaroxaban 20 mg daily. With low actual body weight and age, patient's estimated renal function is at best at or below 50 ml/min. Will decrease rivaroxaban to 15 mg daily.   Ulice Dash, PharmD Clinical Pharmacist

## 2016-10-04 NOTE — Progress Notes (Signed)
[facility] [facility] [place] NAME: Leonidas Dunstan    MR#:  [id number]  DATE OF BIRTH:  01/31/1927  SUBJECTIVE:   Patient here with confusion and found to have pneumonia  REVIEW OF SYSTEMS:    Review of Systems  Unable to perform ROS: Dementia    Tolerating Diet:yes      DRUG ALLERGIES:  No Known Allergies  VITALS:  Blood pressure (!) 91/48, pulse (!) 59, temperature 99.7 F (37.6 C), resp. rate (!) 22, height 5\' 7"  (1.702 m), weight 56.3 kg (124 lb 1.9 oz), SpO2 98 %.  PHYSICAL EXAMINATION:   Physical Exam    LABORATORY PANEL:   CBC  Recent Labs Lab 10/04/16 0302  WBC 9.7  HGB 10.1*  HCT 30.8*  PLT 195   ------------------------------------------------------------------------------------------------------------------  Chemistries   Recent Labs Lab 10/03/16 1857 10/04/16 0302  NA 140 140  K 3.9 3.3*  CL 108 112*  CO2 22 21*  GLUCOSE 131* 160*  BUN 32* 30*  CREATININE 0.95 0.90  CALCIUM 8.6* 8.0*  MG  --  1.7  AST 30  --   ALT 18  --   ALKPHOS 67  --   BILITOT 0.9  --    ------------------------------------------------------------------------------------------------------------------  Cardiac Enzymes  Recent Labs Lab 10/03/16 1857  TROPONINI <0.03   ------------------------------------------------------------------------------------------------------------------  RADIOLOGY:  Dg Chest Port 1 View  Result Date: 10/03/2016 CLINICAL DATA:  Short of breath, cough, fever EXAM: PORTABLE CHEST 1 VIEW COMPARISON:  08/25/2016 FINDINGS: Left lower lobe infiltrate has developed since the prior study and is compatible with pneumonia. No effusion. Prior CABG. Single lead pacemaker unchanged in position. COPD with hyperinflation. IMPRESSION: Left lower lobe infiltrate compatible with pneumonia Electronically Signed   By: Franchot Gallo M.D.   On: 10/03/2016 19:37     ASSESSMENT AND PLAN:   82 year old male with  a body dementia and ASCVD who presents from nursing home with sepsis and metabolic encephalopathy due to pneumonia  1. Sepsis due to pneumonia: Continue cefepime. MRSA PCR is negative and therefore will discontinue vancomycin. Follow up on blood cultures. Continue maintenance fluids  2. Healthcare associated pneumonia: Continue cefepime  3. Acute hypoxic respiratory failure in the setting of pneumonia: Patient is a mouth rebreather and therefore is on Venturi mask.  4. History of atrial fibrillation: Continue xarelto and sotalol  5. Hypothyroid: Continue Synthroid  6. Hypokalemia replete and recheck in a.m.    CODE STATUS: dnr  TOTAL TIME TAKING CARE OF THIS PATIENT: 30 minutes.     POSSIBLE D/C 1-2 days, DEPENDING ON CLINICAL CONDITION.   Kyian Obst M.D on 10/04/2016 at 12:27 PM  Between 7am to 6pm - Pager - [phone] After 6pm go to [email or web address] - password [email or web address] [facility]  Office  [phone]  CC: Primary care physician; Maryland Pink, MD  Note: This dictation was prepared with Dragon dictation along with smaller phrase technology. Any transcriptional errors that result from this process are unintentional.

## 2016-10-04 NOTE — Progress Notes (Signed)
Initial Nutrition Assessment  DOCUMENTATION CODES:   Severe malnutrition in context of chronic illness  INTERVENTION:  -Mightyshake II with Lunch - Provides 500 calories and 23 grams of protein -Downgrade diet to NDD2 - Thin Liquids until SLP eval  NUTRITION DIAGNOSIS:   Malnutrition related to chronic illness as evidenced by percent weight loss, severe depletion of body fat, severe depletion of muscle mass.  GOAL:   Patient will meet greater than or equal to 90% of their needs  MONITOR:   PO intake, I & O's, Labs, Weight trends  REASON FOR ASSESSMENT:   Malnutrition Screening Tool    ASSESSMENT:   Steve Aguirre  is a 83 y.o. male with a known history of lewy body dementia, atrial fibrillation, prostate cancer, coronary artery disease, hyperlipidemia, hypertension he is a resident of [facility]. Patient was found to be hypoxic short of breath and had fever. He was also lethargic and confused and he was transferred via EMS to our hospital emergency room.  He was found to have L Lung PNA, Septic.  Patient presented with MST of 2, has been since changed to 1 but saw patient anyways. He has a hx of lewy body dementia - said no to everything I asked him, unsure of validity. Per chart, pt exhibits a 28#/18.4% severe wt loss over 3.5 months. Per RN, he was doing well until he fell a few months ago, now eats no solid food. Patient denies hunger at this time. Denies wt loss. On heart healthy diet but consuming no food at this time. Nutrition-Focused physical exam completed. Findings are severe fat depletion, severe muscle depletion, and no edema.  Was previously on NDD2 when seen by SLP - 08/2016 Labs and Medications reviewed: K 3.3, B12 NS @ 68mL/hr Senokot-S  Diet Order:  Diet Heart Room service appropriate? Yes; Fluid consistency: Thin  Skin:  Wound (see comment) (Stg I to Coccyx)  Last BM:  10/04/2016  Height:   Ht Readings from Last 1 Encounters:  10/04/16 5'  7" (1.702 m)    Weight:   Wt Readings from Last 1 Encounters:  10/04/16 124 lb 1.9 oz (56.3 kg)    Ideal Body Weight:  67.27 kg  BMI:  Body mass index is 19.44 kg/m.  Estimated Nutritional Needs:   Kcal:  1700-2000 calories  Protein:  67-85 gm  Fluid:  >/= 1.7L  EDUCATION NEEDS:   No education needs identified at this time  Satira Anis. Steve Plourde, MS, RD LDN Inpatient Clinical Dietitian Pager [phone]

## 2016-10-05 DIAGNOSIS — Z515 Encounter for palliative care: Secondary | ICD-10-CM

## 2016-10-05 DIAGNOSIS — Z66 Do not resuscitate: Secondary | ICD-10-CM

## 2016-10-05 DIAGNOSIS — A419 Sepsis, unspecified organism: Principal | ICD-10-CM

## 2016-10-05 LAB — LACTIC ACID, PLASMA: Lactic Acid, Venous: 1.2 mmol/L (ref 0.5–1.9)

## 2016-10-05 LAB — URINE CULTURE: Culture: NO GROWTH

## 2016-10-05 LAB — BASIC METABOLIC PANEL
Anion gap: 7 (ref 5–15)
BUN: 25 mg/dL — AB (ref 6–20)
CO2: 23 mmol/L (ref 22–32)
CREATININE: 0.81 mg/dL (ref 0.61–1.24)
Calcium: 8.3 mg/dL — ABNORMAL LOW (ref 8.9–10.3)
Chloride: 114 mmol/L — ABNORMAL HIGH (ref 101–111)
GFR calc Af Amer: >60 mL/min (ref >60)
GLUCOSE: 103 mg/dL — AB (ref 65–99)
Potassium: 3.8 mmol/L (ref 3.5–5.1)
SODIUM: 144 mmol/L (ref 135–145)

## 2016-10-05 LAB — MAGNESIUM: MAGNESIUM: 2.1 mg/dL (ref 1.7–2.4)

## 2016-10-05 LAB — PHOSPHORUS: Phosphorus: 2.3 mg/dL — ABNORMAL LOW (ref 2.5–4.6)

## 2016-10-05 MED ORDER — MORPHINE SULFATE (CONCENTRATE) 10 MG/0.5ML PO SOLN
5.0000 mg | ORAL | Status: DC | PRN
  Administered 2016-10-06: 16:00:00 5 mg via ORAL
  Filled 2016-10-05: qty 1

## 2016-10-05 MED ORDER — CEFEPIME-DEXTROSE 2 GM/50ML IV SOLR
2.0000 g | Freq: Every day | INTRAVENOUS | Status: DC
  Filled 2016-10-05: qty 50

## 2016-10-05 MED ORDER — BISACODYL 10 MG RE SUPP: 10.0000 mg | Freq: Every day | RECTAL | Status: DC | PRN

## 2016-10-05 MED ORDER — RIVAROXABAN 15 MG PO TABS: 15.0000 mg | ORAL_TABLET | Freq: Every day | ORAL | 0 refills | Status: DC

## 2016-10-05 MED ORDER — LEVOFLOXACIN 750 MG PO TABS: 750.0000 mg | ORAL_TABLET | Freq: Every day | ORAL | 0 refills | Status: DC

## 2016-10-05 MED ORDER — GLYCOPYRROLATE 0.2 MG/ML IJ SOLN: 0.4000 mg | Freq: Three times a day (TID) | INTRAMUSCULAR | Status: DC

## 2016-10-05 MED ORDER — GLYCOPYRROLATE 1 MG PO TABS
2.0000 mg | ORAL_TABLET | Freq: Three times a day (TID) | ORAL | Status: DC
  Administered 2016-10-05 (×3): 2 mg via ORAL
  Filled 2016-10-05 (×5): qty 2

## 2016-10-05 MED ORDER — MORPHINE SULFATE (CONCENTRATE) 10 MG/0.5ML PO SOLN
5.0000 mg | Freq: Three times a day (TID) | ORAL | Status: DC
  Administered 2016-10-05 – 2016-10-06 (×3): 5 mg via ORAL
  Filled 2016-10-05 (×3): qty 1

## 2016-10-05 NOTE — Clinical Social Work Note (Signed)
CSW has spoken to Dr. Benjie Karvonen who stated that she has spoken to Palliative Care and the discharge to return to [facility] has been cancelled and patient is to be made comfort care and transfer out of ICU today. CSW informed that assessment will be made tomorrow if patient should transition to hospice home or return to [facility]. Shela Leff MSW,LCSW [phone]

## 2016-10-05 NOTE — Discharge Summary (Addendum)
[place] at [place] NAME: Steve Aguirre    MR#:  [id number]  DATE OF BIRTH:  [birth year]  DATE OF ADMISSION:  10/03/2016 ADMITTING PHYSICIAN: Saundra Shelling, MD  DATE OF DISCHARGE: 10/05/2016  PRIMARY CARE PHYSICIAN: Maryland Pink, MD    ADMISSION DIAGNOSIS:  Sepsis, due to unspecified organism [facility]) [A41.9] Aspiration pneumonia of left lower lobe, unspecified aspiration pneumonia type ([place]) [J69.0]  DISCHARGE DIAGNOSIS:  Principal Problem:   Healthcare-associated pneumonia Active Problems:   HCAP (healthcare-associated pneumonia)   Pressure injury of skin   SECONDARY DIAGNOSIS:   Past Medical History:  Diagnosis Date  . Arthritis   . Atrial fibrillation ([place])   . Cancer [facility]    prostate  . Coronary artery disease   . Hyperlipemia   . Hypertension   . Scarlet fever     HOSPITAL COURSE:   82 year old male with a body dementia and ASCVD who presents from nursing home with sepsis and metabolic encephalopathy due to pneumonia  1. Sepsis due to pneumonia: He was started on cefepime. MRSA PCR is negative and therefore Vancomycin was discontinued. Blood cultures have been negative. Sepsis has resolved. 2. Healthcare associated pneumonia: he can be switched to PO Levaquin at discharge.   3. Acute hypoxic respiratory failure in the setting of pneumonia: he was weaned off of oxygen and is back on rooma air.  4. History of atrial fibrillation: Continue xarelto and sotalol  5. Hypothyroid: Continue Synthroid  6. Hypokalemia: This was repleted.  7 Pressure injury stage 1: Continue local wound care and turn patient every 2 hours.  Needs palliatve care consult at facility patient with very grim outlook overall   DISCHARGE CONDITIONS AND DIET:   Stable Dysphagia 2 diet aspiration precautions  CONSULTS OBTAINED:    DRUG ALLERGIES:  No Known Allergies  DISCHARGE MEDICATIONS:   Current Discharge Medication List     START taking these medications   Details  levofloxacin (LEVAQUIN) 750 MG tablet Take 1 tablet (750 mg total) by mouth daily. Qty: 5 tablet, Refills: 0      CONTINUE these medications which have CHANGED   Details  Rivaroxaban (XARELTO) 15 MG TABS tablet Take 1 tablet (15 mg total) by mouth daily with supper. Qty: 42 tablet, Refills: 0      CONTINUE these medications which have NOT CHANGED   Details  acetaminophen (TYLENOL) 325 MG tablet Take 1,000 mg by mouth 3 (three) times daily as needed.     divalproex (DEPAKOTE) 125 MG DR tablet Take 125 mg by mouth 2 (two) times daily.     levothyroxine (SYNTHROID, LEVOTHROID) 50 MCG tablet Take 50 mcg by mouth daily before breakfast.    Multiple Vitamin (THEREMS PO) Take 1 tablet by mouth daily.    sotalol (BETAPACE) 80 MG tablet Take 80 mg by mouth 2 (two) times daily.    vitamin B-12 (CYANOCOBALAMIN) 1000 MCG tablet Take 1,000 mcg by mouth daily.              Today   CHIEF COMPLAINT:  Patient with dementia noa cute events overnight   VITAL SIGNS:  Blood pressure 121/68, pulse 61, temperature 99.3 F (37.4 C), resp. rate 18, height 5\' 7"  (1.702 m), weight 56.3 kg (124 lb 1.9 oz), SpO2 97 %.   REVIEW OF SYSTEMS:  Review of Systems  Unable to perform ROS: Dementia     PHYSICAL EXAMINATION:  GENERAL:  82 y.o.-year-old patient lying in the bed with no acute distress.  NECK:  Supple, no jugular venous distention. No thyroid enlargement, no tenderness.  LUNGS: Normal breath sounds bilaterally, no wheezing, rales,rhonchi  No use of accessory muscles of respiration.  CARDIOVASCULAR: S1, S2 normal. No murmurs, rubs, or gallops.  ABDOMEN: Soft, non-tender, non-distended. Bowel sounds present. No organomegaly or mass.  EXTREMITIES: No pedal edema, cyanosis, or clubbing.  PSYCHIATRIC: The patient is alert and oriented x name  SKIN: stage 1 heels and sacrum  DATA REVIEW:   CBC  Recent Labs Lab 10/04/16 0302  WBC 9.7   HGB 10.1*  HCT 30.8*  PLT 195    Chemistries   Recent Labs Lab 10/03/16 1857  10/05/16 0459  NA 140  < > 144  K 3.9  < > 3.8  CL 108  < > 114*  CO2 22  < > 23  GLUCOSE 131*  < > 103*  BUN 32*  < > 25*  CREATININE 0.95  < > 0.81  CALCIUM 8.6*  < > 8.3*  MG  --   < > 2.1  AST 30  --   --   ALT 18  --   --   ALKPHOS 67  --   --   BILITOT 0.9  --   --   < > = values in this interval not displayed.  Cardiac Enzymes  Recent Labs Lab 10/03/16 1857  TROPONINI <0.03    Microbiology Results  @[email or web address] @  RADIOLOGY:  Dg Chest Port 1 View  Result Date: 10/03/2016 CLINICAL DATA:  Short of breath, cough, fever EXAM: PORTABLE CHEST 1 VIEW COMPARISON:  08/25/2016 FINDINGS: Left lower lobe infiltrate has developed since the prior study and is compatible with pneumonia. No effusion. Prior CABG. Single lead pacemaker unchanged in position. COPD with hyperinflation. IMPRESSION: Left lower lobe infiltrate compatible with pneumonia Electronically Signed   By: Franchot Gallo M.D.   On: 10/03/2016 19:37       Stable for discharge   Patient should follow up with pcp  CODE STATUS:     Code Status Orders        Start     Ordered   10/03/16 2117  Do not attempt resuscitation (DNR)  Continuous    Question Answer Comment  In the event of cardiac or respiratory ARREST Do not call a "code blue"   In the event of cardiac or respiratory ARREST Do not perform Intubation, CPR, defibrillation or ACLS   In the event of cardiac or respiratory ARREST Use medication by any route, position, wound care, and other measures to relive pain and suffering. May use oxygen, suction and manual treatment of airway obstruction as needed for comfort.      10/03/16 2117    Code Status History    Date Active Date Inactive Code Status Order ID Comments User Context   08/26/2016 11:28 AM 08/30/2016  4:45 PM DNR [id number]  Theodoro Grist, MD Inpatient   08/26/2016  3:04 AM 08/26/2016 11:28 AM Full Code  [id number]  Harrie Foreman, MD ED   01/26/2016  2:37 PM 01/27/2016  8:13 PM Full Code [id number]  Isaias Cowman, MD Inpatient    Advance Directive Documentation   Flowsheet Row Most Recent Value  Type of Advance Directive  Out of facility DNR (pink MOST or yellow form)  Pre-existing out of facility DNR order (yellow form or pink MOST form)  No data  "MOST" Form in Place?  No data      TOTAL TIME TAKING CARE OF THIS PATIENT: 23  minutes.    Note: This dictation was prepared with Dragon dictation along with smaller phrase technology. Any transcriptional errors that result from this process are unintentional.  Matheau Orona M.D on 10/05/2016 at 9:06 AM  Between 7am to 6pm - Pager - [phone] After 6pm go to [email or web address] - password [email or web address] [facility]  Office  [phone]  CC: Primary care physician; Maryland Pink, MD

## 2016-10-05 NOTE — Progress Notes (Signed)
PT Cancellation Note  Patient Details Name: Steve Aguirre MRN: [id number] DOB: Oct 03, 1927   Cancelled Treatment:    Reason Eval/Treat Not Completed: Other (comment). Consult discontinued at this time. Will complete orders as pt on comfort care.   Jaciel Diem 10/05/2016, 10:04 AM Greggory Stallion, PT, DPT [phone]

## 2016-10-05 NOTE — Consult Note (Signed)
Consultation Note Date: 10/05/2016   Patient Name: Steve Aguirre  DOB: 1927-04-14  MRN: [id number]  Age / Sex: 82 y.o., male  PCP: Maryland Pink, MD Referring Physician: Bettey Costa, MD  Reason for Consultation: Establishing goals of care, Non pain symptom management, Pain control and Psychosocial/spiritual support  HPI/Patient Profile: 82 y.o. male  admitted on 10/03/2016 with   known history of lewy body dementia, atrial fibrillation, prostate cancer, coronary artery disease, hyperlipidemia, hypertension he is a resident of [facility].   Patient was found to be hypoxic short of breath and had fever. He was also lethargic and confused and he was transferred via EMS to our hospital emergency room. Patient was put on oxygen by nasal cannula at 4 L by the EMS and he still was saturating around 80%. Patient is mostly bedbound in the nursing home dependent on activities of daily living.    Patient was worked up his chest x-ray showed left lung pneumonia and he was started on broad-spectrum antibiotics.  Continued physical, functional and cognitive decline over the past 6 months.  Family desires comfort as the focus of care   Clinical Assessment and Goals of Care:  This NP Wadie Lessen reviewed medical records, received report from team, assessed the patient and then meet at the patient's bedside along with his wife and daughter to discuss diagnosis, prognosis, GOC, EOL wishes disposition and options.  A detailed discussion was had today regarding advanced directives.  Concepts specific to code status, artifical feeding and hydration, continued IV antibiotics and rehospitalization was had.  The difference between a aggressive medical intervention path  and a palliative comfort care path for this patient at this time was had.  Values and goals of care important to patient and family were attempted to be  elicited.  MOST form completed  Concept of Hospice and Palliative Care were discussed  Natural trajectory and expectations at EOL were discussed.  Questions and concerns addressed.   Family encouraged to call with questions or concerns.  PMT will continue to support holistically.      SUMMARY OF RECOMMENDATIONS    -focus of care is comfort  Code Status/Advance Care Planning:  DNR    Symptom Management:   Chronic pain: Roxanol 5 mg po/sl every 8 hrs; scheduled                              Roxanol 5 mg po/sl every 1 hr prn pain/dyspnea  Palliative Prophylaxis:   Aspiration, Bowel Regimen, Frequent Pain Assessment and Oral Care  Additional Recommendations (Limitations, Scope, Preferences):  Full Comfort Care  Psycho-social/Spiritual:   Desire for further Chaplaincy support:no- declined   Additional Recommendations: Education on Hospice and Grief/Bereavement Support  Prognosis:   < 2 weeks  Discharge Planning: Evaluate in the morning for hospice facility     Primary Diagnoses: Present on Admission: . Healthcare-associated pneumonia . HCAP (healthcare-associated pneumonia)   I have reviewed the medical record, interviewed the  patient and family, and examined the patient. The following aspects are pertinent.  Past Medical History:  Diagnosis Date  . Arthritis   . Atrial fibrillation ([place])   . Cancer [facility])    prostate  . Coronary artery disease   . Hyperlipemia   . Hypertension   . Scarlet fever    Social History   Social History  . Marital status: Married    Spouse name: N/A  . Number of children: N/A  . Years of education: N/A   Occupational History  . retired    Social History Main Topics  . Smoking status: Former Research scientist (life sciences)  . Smokeless tobacco: Never Used  . Alcohol use No     Comment: occ.  . Drug use: No  . Sexual activity: Not Asked   Other Topics Concern  . None   Social History Narrative  . None   History reviewed. No  pertinent family history. Scheduled Meds: . ipratropium-albuterol  3 mL Nebulization Q6H  . levothyroxine  50 mcg Oral QAC breakfast  . morphine CONCENTRATE  5 mg Oral Q8H  . sodium chloride  1,000 mL Intravenous Once  . sodium chloride flush  3 mL Intravenous Q12H  . Valproate Sodium  125 mg Oral BID   Continuous Infusions:  PRN Meds:.acetaminophen **OR** acetaminophen, ondansetron **OR** ondansetron (ZOFRAN) IV, senna-docusate Medications Prior to Admission:  Prior to Admission medications   Medication Sig Start Date End Date Taking? Authorizing Provider  acetaminophen (TYLENOL) 325 MG tablet Take 1,000 mg by mouth 3 (three) times daily as needed.    Yes Historical Provider, MD  divalproex (DEPAKOTE) 125 MG DR tablet Take 125 mg by mouth 2 (two) times daily.  09/22/16 10/06/16 Yes Historical Provider, MD  levothyroxine (SYNTHROID, LEVOTHROID) 50 MCG tablet Take 50 mcg by mouth daily before breakfast.   Yes Historical Provider, MD  Multiple Vitamin (THEREMS PO) Take 1 tablet by mouth daily.   Yes Historical Provider, MD  rivaroxaban (XARELTO) 20 MG TABS tablet Take 1 tablet (20 mg total) by mouth daily with supper. 08/28/16  Yes Reche Dixon, PA-C  sotalol (BETAPACE) 80 MG tablet Take 80 mg by mouth 2 (two) times daily.   Yes Historical Provider, MD  vitamin B-12 (CYANOCOBALAMIN) 1000 MCG tablet Take 1,000 mcg by mouth daily.   Yes Historical Provider, MD  levofloxacin (LEVAQUIN) 750 MG tablet Take 1 tablet (750 mg total) by mouth daily. 10/05/16   Bettey Costa, MD  Rivaroxaban (XARELTO) 15 MG TABS tablet Take 1 tablet (15 mg total) by mouth daily with supper. 10/05/16   Bettey Costa, MD   No Known Allergies Review of Systems  Unable to perform ROS: Dementia    Physical Exam  Constitutional: He appears cachectic. He appears ill.  HENT:  Mouth/Throat: Oropharynx is clear and moist.  -audible throat secretions  Cardiovascular: Normal rate, regular rhythm and normal heart sounds.     Pulmonary/Chest: He has decreased breath sounds.  Musculoskeletal:  -generalized weakness and atrophy  Neurological: He is alert.  -confused, unable to follow commands  Skin: Skin is warm and dry.    Vital Signs: BP 121/68   Pulse 61   Temp 99.3 F (37.4 C)   Resp 18   Ht 5\' 7"  (1.702 m)   Wt 56.3 kg (124 lb 1.9 oz)   SpO2 97%   BMI 19.44 kg/m  Pain Assessment: No/denies pain       SpO2: SpO2: 97 % O2 Device:SpO2: 97 % O2 Flow Rate: .O2 Flow  Rate (L/min): 2 L/min  IO: Intake/output summary:  Intake/Output Summary (Last 24 hours) at 10/05/16 0946 Last data filed at 10/04/16 1800  Gross per 24 hour  Intake              900 ml  Output              275 ml  Net              625 ml    LBM: Last BM Date: 10/04/16 Baseline Weight: Weight: 61.1 kg (134 lb 9.6 oz) Most recent weight: Weight: 56.3 kg (124 lb 1.9 oz)      Palliative Assessment/Data: 30 % at best   Discussed with Dr Benjie Karvonen  Time In: 0830 Time Out: 0945 Time Total: 75 min Greater than 50%  of this time was spent counseling and coordinating care related to the above assessment and plan.  Signed by: Wadie Lessen, NP   Please contact Palliative Medicine Team phone at [phone] for questions and concerns.  For individual provider: See Shea Evans

## 2016-10-05 NOTE — Progress Notes (Signed)
Palliative care met with family. Patient on comfort care now and may be eligible for hospice home.

## 2016-10-05 NOTE — Progress Notes (Signed)
Patient made comfort care by wife and daughter. He is confused. Patient states that he does not have any pain. Report given to [facility].

## 2016-10-06 DIAGNOSIS — R52 Pain, unspecified: Secondary | ICD-10-CM

## 2016-10-06 DIAGNOSIS — J189 Pneumonia, unspecified organism: Secondary | ICD-10-CM

## 2016-10-06 MED ORDER — GLYCOPYRROLATE 0.2 MG/ML IJ SOLN
0.4000 mg | Freq: Three times a day (TID) | INTRAMUSCULAR | Status: DC
  Administered 2016-10-06 (×2): 0.4 mg via INTRAVENOUS
  Filled 2016-10-06 (×2): qty 2

## 2016-10-06 MED ORDER — MORPHINE SULFATE (CONCENTRATE) 10 MG/0.5ML PO SOLN: 5.0000 mg | Freq: Three times a day (TID) | ORAL | 0 refills | Status: AC

## 2016-10-06 MED ORDER — BISACODYL 10 MG RE SUPP: 10.0000 mg | Freq: Every day | RECTAL | 0 refills | Status: AC | PRN

## 2016-10-06 MED ORDER — MORPHINE SULFATE (CONCENTRATE) 10 MG/0.5ML PO SOLN: 5.0000 mg | Freq: Two times a day (BID) | ORAL | 0 refills | Status: AC | PRN

## 2016-10-06 MED ORDER — ONDANSETRON HCL 4 MG PO TABS: 4.0000 mg | ORAL_TABLET | Freq: Four times a day (QID) | ORAL | 0 refills | Status: AC | PRN

## 2016-10-06 NOTE — Discharge Summary (Addendum)
[place] [facility] [place] NAME: Corvus Marocco    MR#:  [id number]  DATE OF BIRTH:  10/20/1927  DATE OF ADMISSION:  10/03/2016 ADMITTING PHYSICIAN: Saundra Shelling, MD  DATE OF DISCHARGE: 10/06/2016  PRIMARY CARE PHYSICIAN: Maryland Pink, MD    ADMISSION DIAGNOSIS:  Sepsis, due to unspecified organism [facility]) [A41.9] Aspiration pneumonia of left lower lobe, unspecified aspiration pneumonia type ([place]) [J69.0]  DISCHARGE DIAGNOSIS:  Principal Problem:   Healthcare-associated pneumonia Active Problems:   HCAP (healthcare-associated pneumonia)   Pressure injury of skin   DNR (do not resuscitate)   Palliative care by specialist   Sepsis ([place])   SECONDARY DIAGNOSIS:   Past Medical History:  Diagnosis Date  . Arthritis   . Atrial fibrillation ([place])   . Cancer [facility])    prostate  . Coronary artery disease   . Hyperlipemia   . Hypertension   . Scarlet fever     HOSPITAL COURSE:   82 year old male with a body dementia and ASCVD who presents from nursing home with sepsis and metabolic encephalopathy due to pneumonia  1. Sepsis due to pneumonia: He was started on cefepime. MRSA PCR is negative and therefore Vancomycin was discontinued. Blood cultures have been negative. Sepsis has resolved. 2. Healthcare associated pneumonia: he can be switched to PO Levaquin at discharge.   3. Acute hypoxic respiratory failure in the setting of pneumonia: he was weaned off of oxygen and is back on rooma air.  4. History of atrial fibrillation: Continue xarelto and sotalol  5. Hypothyroid: Continue Synthroid  6. Hypokalemia: This was repleted.  7 Pressure injury stage 1: Continue local wound care and turn patient every 2 hours.  Discharge to hospice  DISCHARGE CONDITIONS AND DIET:   Stable Dysphagia 2 diet aspiration precautions  CONSULTS OBTAINED:    DRUG ALLERGIES:  No Known Allergies  DISCHARGE MEDICATIONS:   Current Discharge  Medication List    START taking these medications   Details  bisacodyl (DULCOLAX) 10 MG suppository Place 1 suppository (10 mg total) rectally daily as needed for mild constipation or moderate constipation. Qty: 12 suppository, Refills: 0    !! Morphine Sulfate (MORPHINE CONCENTRATE) 10 MG/0.5ML SOLN concentrated solution Take 0.25 mLs (5 mg total) by mouth every 12 (twelve) hours as needed for severe pain. Qty: 180 mL, Refills: 0    !! Morphine Sulfate (MORPHINE CONCENTRATE) 10 MG/0.5ML SOLN concentrated solution Take 0.25 mLs (5 mg total) by mouth every 8 (eight) hours. Qty: 180 mL, Refills: 0    ondansetron (ZOFRAN) 4 MG tablet Take 1 tablet (4 mg total) by mouth every 6 (six) hours as needed for nausea. Qty: 20 tablet, Refills: 0     !! - Potential duplicate medications found. Please discuss with provider.    CONTINUE these medications which have NOT CHANGED   Details  acetaminophen (TYLENOL) 325 MG tablet Take 1,000 mg by mouth 3 (three) times daily as needed.            STOP taking these medications     levothyroxine (SYNTHROID, LEVOTHROID) 50 MCG tablet      Multiple Vitamin (THEREMS PO)      rivaroxaban (XARELTO) 20 MG TABS tablet      sotalol (BETAPACE) 80 MG tablet      vitamin B-12 (CYANOCOBALAMIN) 1000 MCG tablet     divalproex (DEPAKOTE) 125 MG DR tablet          Today   CHIEF COMPLAINT:  Patient with dementia  noa cute events overnight   VITAL SIGNS:  Blood pressure (!) 148/66, pulse 68, temperature 98 F (36.7 C), temperature source Oral, resp. rate 16, height 5\' 7"  (1.702 m), weight 56.3 kg (124 lb 1.9 oz), SpO2 99 %.   REVIEW OF SYSTEMS:  Review of Systems  Unable to perform ROS: Dementia     PHYSICAL EXAMINATION:  GENERAL:  82 y.o.-year-old patient lying in the bed with no acute distress.  NECK:  Supple, no jugular venous distention. No thyroid enlargement, no tenderness.  LUNGS: Normal breath sounds bilaterally, no wheezing,  rales,rhonchi  No use of accessory muscles of respiration.  CARDIOVASCULAR: S1, S2 normal. No murmurs, rubs, or gallops.  ABDOMEN: Soft, non-tender, non-distended. Bowel sounds present. No organomegaly or mass.  EXTREMITIES: No pedal edema, cyanosis, or clubbing.  PSYCHIATRIC: The patient is alert and oriented x name  SKIN: stage 1 heels and sacrum  DATA REVIEW:   CBC  Recent Labs Lab 10/04/16 0302  WBC 9.7  HGB 10.1*  HCT 30.8*  PLT 195    Chemistries   Recent Labs Lab 10/03/16 1857  10/05/16 0459  NA 140  < > 144  K 3.9  < > 3.8  CL 108  < > 114*  CO2 22  < > 23  GLUCOSE 131*  < > 103*  BUN 32*  < > 25*  CREATININE 0.95  < > 0.81  CALCIUM 8.6*  < > 8.3*  MG  --   < > 2.1  AST 30  --   --   ALT 18  --   --   ALKPHOS 67  --   --   BILITOT 0.9  --   --   < > = values in this interval not displayed.  Cardiac Enzymes  Recent Labs Lab 10/03/16 1857  TROPONINI <0.03    Microbiology Results  @MICRORSLT48 @  RADIOLOGY:  No results found.     Stable for discharge   Patient should follow up with pcp  CODE STATUS:     Code Status Orders        Start     Ordered   10/03/16 2117  Do not attempt resuscitation (DNR)  Continuous    Question Answer Comment  In the event of cardiac or respiratory ARREST Do not call a "code blue"   In the event of cardiac or respiratory ARREST Do not perform Intubation, CPR, defibrillation or ACLS   In the event of cardiac or respiratory ARREST Use medication by any route, position, wound care, and other measures to relive pain and suffering. May use oxygen, suction and manual treatment of airway obstruction as needed for comfort.      10/03/16 2117    Code Status History    Date Active Date Inactive Code Status Order ID Comments User Context   08/26/2016 11:28 AM 08/30/2016  4:45 PM DNR [id number]  Theodoro Grist, MD Inpatient   08/26/2016  3:04 AM 08/26/2016 11:28 AM Full Code [id number]  Harrie Foreman, MD ED   01/26/2016   2:37 PM 01/27/2016  8:13 PM Full Code [id number]  Isaias Cowman, MD Inpatient    Advance Directive Documentation   Flowsheet Row Most Recent Value  Type of Advance Directive  Out of facility DNR (pink MOST or yellow form)  Pre-existing out of facility DNR order (yellow form or pink MOST form)  No data  "MOST" Form in Place?  No data      TOTAL TIME TAKING CARE OF THIS  PATIENT: 37 minutes.    Note: This dictation was prepared with Dragon dictation along with smaller phrase technology. Any transcriptional errors that result from this process are unintentional.  Alik Mawson,  Karenann Cai.D on 10/06/2016 at 9:54 AM  Between 7am to 6pm - Pager - [phone] After 6pm go to [email or web address] - password [email or web address] [place] [facility]  Office  [phone]  CC: Primary care physician; Maryland Pink, MD

## 2016-10-06 NOTE — Progress Notes (Signed)
Patient discharged to hospice home via EMS. Edwina Grossberg S, RN  

## 2016-10-06 NOTE — Care Management Important Message (Signed)
Important Message  Patient Details  Name: Steve Aguirre MRN: [id number] Date of Birth: [birth year]   Medicare Important Message Given:  Yes    Shelbie Ammons, RN 10/06/2016, 11:16 AM

## 2016-10-06 NOTE — Progress Notes (Signed)
CSW spoke to Palliative Care NP discussing discharge plans. Patient's family interested in [facility] home per their conversation. CSW verified with patient's daughterLetitia Aguirre. CSW sent referral to [facility]iaison. Per Santiago Glad there is a bed available today. Informed MD. Completed discharge packet and placed it on patient's chart.  Ernest Pine, MSW, LCSW, [place] Clinical Social Worker [phone]

## 2016-10-06 NOTE — Progress Notes (Signed)
New hospice home referral received from [facility]. Mr. Favata is an 82 year old man with a known history of dementia, atrial fibrillation, prostate cancer, CAD and hypertension admitted to [facility] on 10/15 for treatment of health care associated pneumonia. He has been treated with broad spectrum antibiotics but remains lethargic with no oral intake. Family met with Palliative Medicine NP Wadie Lessen and have chosen to focus on his comfort with transfer to the hospice home for end of life care.  Writer met in the patient room with his HCPOA, daughter Daine Gravel to initiate education regarding hospice services, philosophy and team approach to care with good understanding voiced. Questions answered, consents signed. Patient was alert with eyes open, did not participate in the discussion. He is currently receiving scheduled liquid morphine 5 mg q 8 hrs for management of chronic pain and IV robinul 0.4 mg TID for management of secretions. Mouth care provided. Patient information faxed to hospice referral, report called to the hospice home, EMS notified for transport at 3:45 pm. Hospital care team all aware. Signed portable DNR in place in patient's discharge packet. Thank you for the opportunity to be involved in the care of this patient and his family. Flo Shanks RN, BSN, [facility], hospital Liaison [phone]

## 2016-10-06 NOTE — Progress Notes (Signed)
Daily Progress Note   Patient Name: Steve Aguirre       Date: 10/06/2016 DOB: 06-20-1927  Age: 82 y.o. MRN#: [id number] Attending Physician: Lytle Butte, MD Primary Care Physician: Maryland Pink, MD Admit Date: 10/03/2016  Reason for Consultation/Follow-up: Establishing goals of care, Non pain symptom management, Pain control and Psychosocial/spiritual support  Subjective: - continued conversation with family regarding medical situation, all comfortable with decision for comfort and dignity  -patient continues to decline with minimal po intake, sips only  --  Length of Stay: 3  Current Medications: Scheduled Meds:  . glycopyrrolate  2 mg Oral TID  . morphine CONCENTRATE  5 mg Oral Q8H  . sodium chloride  1,000 mL Intravenous Once  . sodium chloride flush  3 mL Intravenous Q12H  . Valproate Sodium  125 mg Oral BID    Continuous Infusions:    PRN Meds: acetaminophen **OR** acetaminophen, bisacodyl, morphine CONCENTRATE, ondansetron **OR** ondansetron (ZOFRAN) IV  Physical Exam  Constitutional: He appears lethargic. He appears cachectic. He appears ill.  HENT:  Mouth/Throat: Mucous membranes are dry.  -audible throat secretions  Cardiovascular: Normal rate, regular rhythm and normal heart sounds.   Pulmonary/Chest: He has decreased breath sounds.  Musculoskeletal:  - generalized atrophy  Neurological: He appears lethargic.  Skin: Skin is warm and dry.            Vital Signs: BP (!) 148/66 (BP Location: Left Arm)   Pulse 68   Temp 98 F (36.7 C) (Oral)   Resp 16   Ht 5\' 7"  (1.702 m)   Wt 56.3 kg (124 lb 1.9 oz)   SpO2 99%   BMI 19.44 kg/m  SpO2: SpO2: 99 % O2 Device: O2 Device: Not Delivered O2 Flow Rate: O2 Flow Rate (L/min): 2 L/min  Intake/output  summary:  Intake/Output Summary (Last 24 hours) at 10/06/16 0859 Last data filed at 10/06/16 0548  Gross per 24 hour  Intake                0 ml  Output             1075 ml  Net            -1075 ml   LBM: Last BM Date: 10/04/16 Baseline Weight: Weight: 61.1 kg (134 lb 9.6 oz) Most  recent weight: Weight: 56.3 kg (124 lb 1.9 oz)       Palliative Assessment/Data: 20 %       Patient Active Problem List   Diagnosis Date Noted  . DNR (do not resuscitate) 10/05/2016  . Palliative care by specialist 10/05/2016  . Sepsis ([place])   . Pressure injury of skin 10/04/2016  . Healthcare-associated pneumonia 10/03/2016  . HCAP (healthcare-associated pneumonia) 10/03/2016  . Closed right hip fracture ([place]) 08/26/2016  . Atrial fibrillation ([place]) 06/20/2016  . HTN (hypertension) 06/20/2016  . Dyslipidemia 06/20/2016  . Dementia 06/20/2016  . Sick sinus syndrome ([place]) 01/26/2016    Palliative Care Assessment & Plan   Patient Profile: 82 y.o. male  admitted on 10/03/2016 with  known history of lewybody dementia,atrial fibrillation, prostate cancer, coronary artery disease, hyperlipidemia, hypertension he is a resident of [facility] home.  Patient was found to be hypoxic short of breath and had fever.He was also lethargic and confused and he was transferred via EMS to our hospital emergency room.Patient was put on oxygen by nasal cannula at 4 L by the EMS and he still was saturating around 80%.Patient is mostly bedbound in the nursing home dependent on activities of daily living.  Patient was worked up his chest x-ray showed left lung pneumonia and he was started on broad-spectrum antibiotics.  Continued physical, functional and cognitive decline over the past 6 months.  Family desires comfort as the focus of care.  All life prolonging interventions stopped; allow a natural death    Recommendations/Plan:  Continue medications scheduled to enhance comfort and adjust  as needed  Morphine, Rubinol, Depakote  Goals of Care and Additional Recommendations:  Limitations on Scope of Treatment: Full Comfort Care  Code Status:    Code Status Orders        Start     Ordered   10/03/16 2117  Do not attempt resuscitation (DNR)  Continuous    Question Answer Comment  In the event of cardiac or respiratory ARREST Do not call a "code blue"   In the event of cardiac or respiratory ARREST Do not perform Intubation, CPR, defibrillation or ACLS   In the event of cardiac or respiratory ARREST Use medication by any route, position, wound care, and other measures to relive pain and suffering. May use oxygen, suction and manual treatment of airway obstruction as needed for comfort.      10/03/16 2117    Code Status History    Date Active Date Inactive Code Status Order ID Comments User Context   08/26/2016 11:28 AM 08/30/2016  4:45 PM DNR [id number]  Theodoro Grist, MD Inpatient   08/26/2016  3:04 AM 08/26/2016 11:28 AM Full Code [id number]  Harrie Foreman, MD ED   01/26/2016  2:37 PM 01/27/2016  8:13 PM Full Code [id number]  Isaias Cowman, MD Inpatient    Advance Directive Documentation   Flowsheet Row Most Recent Value  Type of Advance Directive  Out of facility DNR (pink MOST or yellow form)  Pre-existing out of facility DNR order (yellow form or pink MOST form)  No data  "MOST" Form in Place?  No data       Prognosis:   < 2 weeks  Discharge Planning:  Hospice facility, family is requesting [facility] having volunteered with them in the past  Care plan was discussed with Dr Lavetta Nielsen  Thank you for allowing the Palliative Medicine Team to assist in the care of this patient.   Time In: 0830 Time  Out: 0910 Total Time 40 min Prolonged Time Billed  no       Greater than 50%  of this time was spent counseling and coordinating care related to the above assessment and plan.  Wadie Lessen, NP  Please contact Palliative Medicine Team phone at [phone] for  questions and concerns.

## 2016-10-08 LAB — CULTURE, BLOOD (ROUTINE X 2)
CULTURE: NO GROWTH
Culture: NO GROWTH
